# Patient Record
Sex: Male | Born: 1960 | Hispanic: No | Marital: Single | Smoking: Former smoker
Health system: Southern US, Community
[De-identification: ages and names within clinical notes are randomized; demographics above are authoritative.]

## PROBLEM LIST (undated history)

## (undated) DIAGNOSIS — R9431 Abnormal electrocardiogram [ECG] [EKG]: Secondary | ICD-10-CM

## (undated) DIAGNOSIS — E7800 Pure hypercholesterolemia, unspecified: Secondary | ICD-10-CM

## (undated) HISTORY — DX: Abnormal electrocardiogram (ECG) (EKG): R94.31

## (undated) HISTORY — DX: Pure hypercholesterolemia, unspecified: E78.00

---

## 2007-09-06 LAB — C-REACTIVE PROTEIN: CRP: 1.1 mg/dL

## 2009-12-25 ENCOUNTER — Ambulatory Visit: Payer: Self-pay | Admitting: Cardiovascular Disease

## 2009-12-25 LAB — LIPID PANEL
Cholesterol: 131 mg/dL (ref 0–200)
HDL: 50 mg/dL (ref 35–70)
LDL Cholesterol: 55 mg/dL
Triglycerides: 129 mg/dL (ref 40–160)

## 2009-12-25 LAB — BASIC METABOLIC PANEL: Glucose: 109 mg/dL

## 2010-06-03 ENCOUNTER — Encounter: Payer: Self-pay | Admitting: Cardiovascular Disease

## 2010-06-03 DIAGNOSIS — R9431 Abnormal electrocardiogram [ECG] [EKG]: Secondary | ICD-10-CM | POA: Insufficient documentation

## 2010-06-03 DIAGNOSIS — F419 Anxiety disorder, unspecified: Secondary | ICD-10-CM | POA: Insufficient documentation

## 2010-06-03 DIAGNOSIS — E785 Hyperlipidemia, unspecified: Secondary | ICD-10-CM | POA: Insufficient documentation

## 2010-06-26 ENCOUNTER — Ambulatory Visit (INDEPENDENT_AMBULATORY_CARE_PROVIDER_SITE_OTHER): Payer: BC Managed Care – PPO | Admitting: Cardiovascular Disease

## 2010-06-26 DIAGNOSIS — R9431 Abnormal electrocardiogram [ECG] [EKG]: Secondary | ICD-10-CM

## 2010-06-26 DIAGNOSIS — E7800 Pure hypercholesterolemia, unspecified: Secondary | ICD-10-CM

## 2010-08-19 ENCOUNTER — Other Ambulatory Visit: Payer: Self-pay | Admitting: Cardiovascular Disease

## 2010-08-19 DIAGNOSIS — E785 Hyperlipidemia, unspecified: Secondary | ICD-10-CM

## 2010-08-22 NOTE — Telephone Encounter (Signed)
[facility] °

## 2010-10-01 ENCOUNTER — Encounter: Payer: Self-pay | Admitting: Cardiovascular Disease

## 2010-11-16 ENCOUNTER — Other Ambulatory Visit: Payer: Self-pay | Admitting: Cardiovascular Disease

## 2010-11-18 ENCOUNTER — Other Ambulatory Visit: Payer: Self-pay | Admitting: *Deleted

## 2010-11-18 MED ORDER — NIACIN 1000 MG PO TBCR: 1.0000 | EXTENDED_RELEASE_TABLET | Freq: Every day | ORAL | Status: DC

## 2010-11-18 NOTE — Telephone Encounter (Signed)
Fax received from pharmacy. Refill completed. Jodette Angelie Kram RN  

## 2010-11-18 NOTE — Telephone Encounter (Signed)
Fax received from pharmacy. Refill completed. Jodette Addelyn Alleman RN  

## 2011-03-31 ENCOUNTER — Other Ambulatory Visit: Payer: Self-pay | Admitting: *Deleted

## 2011-03-31 MED ORDER — OMEGA-3-ACID ETHYL ESTERS 1 G PO CAPS: 1.0000 g | ORAL_CAPSULE | Freq: Three times a day (TID) | ORAL | Status: DC

## 2011-04-08 ENCOUNTER — Telehealth: Payer: Self-pay | Admitting: Cardiovascular Disease

## 2011-04-08 NOTE — Telephone Encounter (Signed)
Dr Elease Hashimoto only refills cardiac meds, msg called to pt.

## 2011-04-08 NOTE — Telephone Encounter (Signed)
Pt wants a new rx for wellbutrin. Please call to [facility] on [address]. please let him know

## 2011-05-29 ENCOUNTER — Ambulatory Visit (INDEPENDENT_AMBULATORY_CARE_PROVIDER_SITE_OTHER): Payer: BC Managed Care – PPO | Admitting: Family Medicine

## 2011-05-29 DIAGNOSIS — M79609 Pain in unspecified limb: Secondary | ICD-10-CM

## 2011-05-29 DIAGNOSIS — Z23 Encounter for immunization: Secondary | ICD-10-CM

## 2011-06-25 ENCOUNTER — Telehealth: Payer: Self-pay | Admitting: Cardiovascular Disease

## 2011-06-25 DIAGNOSIS — E785 Hyperlipidemia, unspecified: Secondary | ICD-10-CM

## 2011-06-25 NOTE — Telephone Encounter (Signed)
MSG LEFT TO COME TO APP FASTING OR BRING COPY OF LABS IF DONE ELSEWHERE,

## 2011-06-25 NOTE — Telephone Encounter (Signed)
New Msg: pt call to schedule yearly appt and wanted to know if pt needed lab work. If so please put order in.

## 2011-07-14 ENCOUNTER — Ambulatory Visit (INDEPENDENT_AMBULATORY_CARE_PROVIDER_SITE_OTHER): Payer: BC Managed Care – PPO | Admitting: Cardiovascular Disease

## 2011-07-14 ENCOUNTER — Encounter: Payer: Self-pay | Admitting: Cardiovascular Disease

## 2011-07-14 VITALS — BP 119/74 | HR 65 | Resp 12 | Ht 71.0 in | Wt 172.8 lb

## 2011-07-14 DIAGNOSIS — E785 Hyperlipidemia, unspecified: Secondary | ICD-10-CM

## 2011-07-14 LAB — BASIC METABOLIC PANEL
BUN: 14 mg/dL (ref 6–23)
CO2: 28 mEq/L (ref 19–32)
Calcium: 9.1 mg/dL (ref 8.4–10.5)
GFR: 97.64 mL/min (ref >60.00)
Glucose, Bld: 109 mg/dL — ABNORMAL HIGH (ref 70–99)

## 2011-07-14 LAB — HEPATIC FUNCTION PANEL
ALT: 24 U/L (ref 0–53)
AST: 16 U/L (ref 0–37)
Alkaline Phosphatase: 63 U/L (ref 39–117)
Bilirubin, Direct: 0 mg/dL (ref 0.0–0.3)
Total Bilirubin: 0.5 mg/dL (ref 0.3–1.2)

## 2011-07-14 LAB — LIPID PANEL
Cholesterol: 125 mg/dL (ref 0–200)
HDL: 46.1 mg/dL (ref >39.00)
LDL Cholesterol: 65 mg/dL (ref 0–99)
Triglycerides: 70 mg/dL (ref 0.0–149.0)
VLDL: 14 mg/dL (ref 0.0–40.0)

## 2011-07-14 NOTE — Assessment & Plan Note (Signed)
He seems to be doing well. We checked a fasting lipid profile as well as basic metabolic profile and hepatic profile. I'll see him in one year. He is to continue with an exercise program. He'll continue his current medications.

## 2011-07-14 NOTE — Progress Notes (Signed)
    Earney Mallet Date of Birth  [birth year] [facility]     [place] Office  [address]    [address]   [address] [place], [place]  [address]    [place], [place]  [address] [phone]  Fax  [phone]  [phone]  Fax [phone]  Problem list:  1. Hypercholesterolemia   History of Present Illness:  German is a 51 yo with hx of hyperlipidemia.  He is having some foot problems and is scheduled to see a podiatrist.  He thinks he may have some gout.  He has tried prednisone but it did not seem to help.   He has not had any cardiac problems   Current Outpatient Prescriptions on File Prior to Visit  Medication Sig Dispense Refill  . aspirin 81 MG tablet Take 81 mg by mouth daily.        Marland Kitchen buPROPion (WELLBUTRIN XL) 150 MG 24 hr tablet Take 150 mg by mouth daily.        Marland Kitchen LIPITOR 40 MG tablet TAKE ONE (1) TABLET(S) ONCE DAILY  30 tablet  11  . niacin (NIASPAN) 1000 MG CR tablet Take 1 tablet (1,000 mg total) by mouth at bedtime.  90 tablet  1  . omega-3 acid ethyl esters (LOVAZA) 1 G capsule Take 1 capsule (1 g total) by mouth 3 (three) times daily.  90 capsule  3  . Multiple Vitamin (MULTIVITAMIN) tablet Take 1 tablet by mouth daily.        . Niacin 1000 MG TBCR Take 1 tablet (1,000 mg total) by mouth daily.  90 each  1    No Known Allergies  Past Medical History  Diagnosis Date  . High cholesterol   . Abnormal EKG     No past surgical history on file.  History  Smoking status  . Former Smoker  . Quit date: 03/06/2006  Smokeless tobacco  . Not on file    History  Alcohol Use  . Yes    Family History  Problem Relation Age of Onset  . Leukemia Father     deceased age 34  . Coronary artery disease Father     CABG/AAA/Stents    Reviw of Systems:  Reviewed in the HPI.  All other systems are negative.  Physical Exam: Blood pressure 119/74, pulse 65, resp. rate 12, weight 172 lb 12.8 oz (78.382 kg). General: Well developed, well  nourished, in no acute distress.  Head: Normocephalic, atraumatic, sclera non-icteric, mucus membranes are moist,   Neck: Supple. Negative for carotid bruits. JVD not elevated.  Lungs: Clear bilaterally to auscultation without wheezes, rales, or rhonchi. Breathing is unlabored.  Heart: RRR with S1 S2. No murmurs, rubs, or gallops appreciated.  Abdomen: Soft, non-tender, non-distended with normoactive bowel sounds. No hepatomegaly. No rebound/guarding. No obvious abdominal masses.  Msk:  Strength and tone appear normal for age.  Extremities: No clubbing or cyanosis. No edema.  Distal pedal pulses are 2+ and equal bilaterally.  Neuro: Alert and oriented X 3. Moves all extremities spontaneously.  Psych:  Responds to questions appropriately with a normal affect.  ECG: NSR. Normal ecg   Assessment / Plan:

## 2011-07-14 NOTE — Patient Instructions (Signed)
Your physician wants you to follow-up in: 1 year.   You will receive a reminder letter in the mail two months in advance. If you don't receive a letter, please call our office to schedule the follow-up appointment.  Your physician recommends that you return for fasting lab work in: 1 year (day of appt)

## 2011-08-11 ENCOUNTER — Other Ambulatory Visit: Payer: Self-pay | Admitting: *Deleted

## 2011-08-11 MED ORDER — NIACIN ER (ANTIHYPERLIPIDEMIC) 1000 MG PO TBCR: 1000.0000 mg | EXTENDED_RELEASE_TABLET | Freq: Every day | ORAL | Status: DC

## 2011-08-11 NOTE — Telephone Encounter (Signed)
Fax Received. Refill Completed. Jeremy Blair (R.M.A)   

## 2011-09-23 ENCOUNTER — Other Ambulatory Visit: Payer: Self-pay | Admitting: *Deleted

## 2011-09-23 DIAGNOSIS — E785 Hyperlipidemia, unspecified: Secondary | ICD-10-CM

## 2011-09-23 MED ORDER — ATORVASTATIN CALCIUM 40 MG PO TABS: 40.0000 mg | ORAL_TABLET | Freq: Every day | ORAL | Status: DC

## 2012-06-03 ENCOUNTER — Other Ambulatory Visit: Payer: Self-pay | Admitting: *Deleted

## 2012-06-03 MED ORDER — OMEGA-3-ACID ETHYL ESTERS 1 G PO CAPS: 1.0000 g | ORAL_CAPSULE | Freq: Three times a day (TID) | ORAL | Status: DC

## 2012-06-03 NOTE — Telephone Encounter (Signed)
Left message for patient to call office to make an appointment for more refill to be dispensed. number provided. Fax Received. Refill Completed. Jeremy Blair (R.M.A)

## 2012-06-30 ENCOUNTER — Other Ambulatory Visit (INDEPENDENT_AMBULATORY_CARE_PROVIDER_SITE_OTHER): Payer: BC Managed Care – PPO

## 2012-06-30 ENCOUNTER — Ambulatory Visit (INDEPENDENT_AMBULATORY_CARE_PROVIDER_SITE_OTHER): Payer: BC Managed Care – PPO | Admitting: Cardiovascular Disease

## 2012-06-30 ENCOUNTER — Encounter: Payer: Self-pay | Admitting: Cardiovascular Disease

## 2012-06-30 VITALS — BP 116/78 | HR 71 | Ht 71.0 in | Wt 173.8 lb

## 2012-06-30 DIAGNOSIS — E785 Hyperlipidemia, unspecified: Secondary | ICD-10-CM

## 2012-06-30 LAB — BASIC METABOLIC PANEL
BUN: 11 mg/dL (ref 6–23)
CO2: 31 mEq/L (ref 19–32)
Chloride: 102 mEq/L (ref 96–112)
Creatinine, Ser: 1 mg/dL (ref 0.4–1.5)
Glucose, Bld: 107 mg/dL — ABNORMAL HIGH (ref 70–99)

## 2012-06-30 LAB — LIPID PANEL
Cholesterol: 144 mg/dL (ref 0–200)
HDL: 40.5 mg/dL (ref >39.00)
VLDL: 35.8 mg/dL (ref 0.0–40.0)

## 2012-06-30 LAB — HEPATIC FUNCTION PANEL
Albumin: 3.9 g/dL (ref 3.5–5.2)
Total Protein: 7.1 g/dL (ref 6.0–8.3)

## 2012-06-30 NOTE — Progress Notes (Signed)
     Earney Mallet Date of Birth  May 18, 1961 [facility]     [place] Office  [address]    [address]   [address] [place], [place]  [address]    [place], [place]  [address] [phone]  Fax  [phone]  [phone]  Fax [phone]  Problem list:  1. Hypercholesterolemia   History of Present Illness:  Loman is a 52 yo with hx of hyperlipidemia.  He is having some foot problems and is scheduled to see a podiatrist.  He thinks he may have some gout.  He has tried prednisone but it did not seem to help.   He has not had any cardiac problems  Feb. 5, 2014:    Current Outpatient Prescriptions on File Prior to Visit  Medication Sig Dispense Refill  . aspirin 81 MG tablet Take 81 mg by mouth daily.        Marland Kitchen atorvastatin (LIPITOR) 40 MG tablet Take 1 tablet (40 mg total) by mouth daily.  30 tablet  11  . buPROPion (WELLBUTRIN XL) 150 MG 24 hr tablet Take 150 mg by mouth daily.        . Multiple Vitamin (MULTIVITAMIN) tablet Take 1 tablet by mouth daily.        . niacin (NIASPAN) 1000 MG CR tablet Take 1 tablet (1,000 mg total) by mouth at bedtime.  90 tablet  5  . omega-3 acid ethyl esters (LOVAZA) 1 G capsule Take 1 capsule (1 g total) by mouth 3 (three) times daily.  90 capsule  1    No Known Allergies  Past Medical History  Diagnosis Date  . High cholesterol   . Abnormal EKG     No past surgical history on file.  History  Smoking status  . Former Smoker  . Quit date: 03/06/2006  Smokeless tobacco  . Not on file    History  Alcohol Use  . Yes    Family History  Problem Relation Age of Onset  . Leukemia Father     deceased age 89  . Coronary artery disease Father     CABG/AAA/Stents    Reviw of Systems:  Reviewed in the HPI.  All other systems are negative.  Physical Exam: Blood pressure 116/78, pulse 71, height 5\' 11"  (1.803 m), weight 173 lb 12.8 oz (78.835 kg). General: Well developed, well nourished, in no acute  distress.  Head: Normocephalic, atraumatic, sclera non-icteric, mucus membranes are moist,   Neck: Supple. Negative for carotid bruits. JVD not elevated.  Lungs: Clear bilaterally to auscultation without wheezes, rales, or rhonchi. Breathing is unlabored.  Heart: RRR with S1 S2. No murmurs, rubs, or gallops appreciated.  Abdomen: Soft, non-tender, non-distended with normoactive bowel sounds. No hepatomegaly. No rebound/guarding. No obvious abdominal masses.  Msk:  Strength and tone appear normal for age.  Extremities: No clubbing or cyanosis. No edema.  Distal pedal pulses are 2+ and equal bilaterally.  Neuro: Alert and oriented X 3. Moves all extremities spontaneously.  Psych:  Responds to questions appropriately with a normal affect.  ECG: 06/30/2012: Normal sinus rhythm at 71 beats a minute. He has no ST or T wave changes.   Assessment / Plan:

## 2012-06-30 NOTE — Patient Instructions (Addendum)
Your physician wants you to follow-up in: 1 year  You will receive a reminder letter in the mail two months in advance. If you don't receive a letter, please call our office to schedule the follow-up appointment.  Your physician recommends that you continue on your current medications as directed. Please refer to the Current Medication list given to you today.   Your physician recommends that you return for a FASTING lipid profile: 1 year

## 2012-06-30 NOTE — Assessment & Plan Note (Signed)
His lipids has been stable.  Lipids are pending for today.  We discussed some of the recent medical studies concerning fenofibrate and Niaspan and their limited benefit in the studies.  I'll see him again in one year for followup office visit and fasting labs.

## 2012-07-10 ENCOUNTER — Other Ambulatory Visit: Payer: Self-pay

## 2012-08-05 ENCOUNTER — Other Ambulatory Visit: Payer: Self-pay | Admitting: *Deleted

## 2012-08-05 MED ORDER — NIACIN ER (ANTIHYPERLIPIDEMIC) 1000 MG PO TBCR: 1000.0000 mg | EXTENDED_RELEASE_TABLET | Freq: Every day | ORAL | Status: DC

## 2012-08-05 NOTE — Telephone Encounter (Signed)
Fax Received. Refill Completed. Octavia Chowoe (R.M.A)   

## 2012-08-12 ENCOUNTER — Telehealth: Payer: Self-pay | Admitting: *Deleted

## 2012-08-12 NOTE — Telephone Encounter (Signed)
Pt informed I will call in to pharmacy that generic is acceptable, pharm called.

## 2012-08-12 NOTE — Telephone Encounter (Signed)
New Problem:    Patient called in returning your call to say that he would be ok switching to generic.  Patient did not recollect any issues with the generic.  Please call back.

## 2012-08-12 NOTE — Telephone Encounter (Signed)
Asked him to call back to see if its ok to switch his niacin to generic, no notes stating he is intolerant of generic formulation.

## 2012-10-25 ENCOUNTER — Other Ambulatory Visit: Payer: Self-pay | Admitting: *Deleted

## 2012-10-25 DIAGNOSIS — E785 Hyperlipidemia, unspecified: Secondary | ICD-10-CM

## 2012-10-25 MED ORDER — ATORVASTATIN CALCIUM 40 MG PO TABS: 40.0000 mg | ORAL_TABLET | Freq: Every day | ORAL | Status: DC

## 2012-10-25 NOTE — Telephone Encounter (Signed)
Fax Received. Refill Completed. Barb Shear Chowoe (R.M.A)   

## 2013-03-31 ENCOUNTER — Other Ambulatory Visit: Payer: Self-pay

## 2013-05-13 ENCOUNTER — Other Ambulatory Visit: Payer: Self-pay | Admitting: Cardiovascular Disease

## 2013-06-12 ENCOUNTER — Other Ambulatory Visit: Payer: Self-pay | Admitting: Cardiovascular Disease

## 2013-06-30 ENCOUNTER — Encounter: Payer: Self-pay | Admitting: Cardiovascular Disease

## 2013-07-09 ENCOUNTER — Other Ambulatory Visit: Payer: Self-pay | Admitting: Cardiovascular Disease

## 2013-09-12 ENCOUNTER — Other Ambulatory Visit: Payer: BC Managed Care – PPO

## 2013-09-15 ENCOUNTER — Ambulatory Visit: Payer: BC Managed Care – PPO | Admitting: Cardiovascular Disease

## 2013-10-03 ENCOUNTER — Other Ambulatory Visit: Payer: Self-pay | Admitting: Cardiovascular Disease

## 2013-10-10 ENCOUNTER — Ambulatory Visit (INDEPENDENT_AMBULATORY_CARE_PROVIDER_SITE_OTHER): Payer: BC Managed Care – PPO | Admitting: *Deleted

## 2013-10-10 DIAGNOSIS — E785 Hyperlipidemia, unspecified: Secondary | ICD-10-CM

## 2013-10-10 LAB — HEPATIC FUNCTION PANEL
ALBUMIN: 3.8 g/dL (ref 3.5–5.2)
ALK PHOS: 56 U/L (ref 39–117)
ALT: 23 U/L (ref 0–53)
AST: 14 U/L (ref 0–37)
BILIRUBIN DIRECT: 0 mg/dL (ref 0.0–0.3)
BILIRUBIN TOTAL: 0.7 mg/dL (ref 0.2–1.2)
Total Protein: 7 g/dL (ref 6.0–8.3)

## 2013-10-10 LAB — BASIC METABOLIC PANEL
BUN: 18 mg/dL (ref 6–23)
CHLORIDE: 103 meq/L (ref 96–112)
CO2: 26 meq/L (ref 19–32)
CREATININE: 1 mg/dL (ref 0.4–1.5)
Calcium: 9.1 mg/dL (ref 8.4–10.5)
GFR: 83.41 mL/min (ref >60.00)
GLUCOSE: 99 mg/dL (ref 70–99)
Potassium: 3.8 mEq/L (ref 3.5–5.1)
Sodium: 137 mEq/L (ref 135–145)

## 2013-10-10 LAB — LIPID PANEL
CHOL/HDL RATIO: 3
Cholesterol: 141 mg/dL (ref 0–200)
HDL: 40.7 mg/dL (ref >39.00)
LDL CALC: 77 mg/dL (ref 0–99)
Triglycerides: 117 mg/dL (ref 0.0–149.0)
VLDL: 23.4 mg/dL (ref 0.0–40.0)

## 2013-10-12 ENCOUNTER — Ambulatory Visit (INDEPENDENT_AMBULATORY_CARE_PROVIDER_SITE_OTHER): Payer: BC Managed Care – PPO | Admitting: Cardiovascular Disease

## 2013-10-12 ENCOUNTER — Encounter: Payer: Self-pay | Admitting: Cardiovascular Disease

## 2013-10-12 VITALS — BP 110/78 | HR 81 | Ht 71.0 in | Wt 169.0 lb

## 2013-10-12 DIAGNOSIS — E785 Hyperlipidemia, unspecified: Secondary | ICD-10-CM

## 2013-10-12 NOTE — Patient Instructions (Signed)
Your physician recommends that you continue on your current medications as directed. Please refer to the Current Medication list given to you today.  Your physician recommends that you return for lab work in: 1 year a week or a few days before your office visit with Dr. Elease HashimotoNahser.  You will need to fast for this appointment - nothing but water after midnight  Your physician wants you to follow-up in: 1 year with Dr. Elease HashimotoNahser. You will receive a reminder letter in the mail two months in advance. If you don't receive a letter, please call our office to schedule the follow-up appointment.

## 2013-10-12 NOTE — Progress Notes (Signed)
     Jeremy Blair Date of Birth  11/03/1960 [facility]     [place] Office  [address]    [address]   [address] [place], [place]  [address]    [place], [place]  [id number] [phone]  Fax  [phone]  [phone]  Fax [phone]  Problem list:  1. Hypercholesterolemia   History of Present Illness:  Jeremy Blair is a 53 yo with hx of hyperlipidemia.  He is having some foot problems and is scheduled to see a podiatrist.  He thinks he may have some gout.  He has tried prednisone but it did not seem to help.   He has not had any cardiac problems  Feb. 5, 2014:  Oct 12, 2013: Jeremy Blair is doing ok.   He stopped his Niacin several months ago due to itching ( different from the flushing)   Labs from 2 days ago look good.   Current Outpatient Prescriptions on File Prior to Visit  Medication Sig Dispense Refill  . aspirin 81 MG tablet Take 81 mg by mouth daily.        Marland Kitchen. atorvastatin (LIPITOR) 40 MG tablet TAKE 1 TABLET BY MOUTH EVERY DAY. PATIENT NEEDS TO MAKE AN APPT  30 tablet  0  . buPROPion (WELLBUTRIN XL) 150 MG 24 hr tablet Take 150 mg by mouth daily.        . Multiple Vitamin (MULTIVITAMIN) tablet Take 1 tablet by mouth daily.        Marland Kitchen. omega-3 acid ethyl esters (LOVAZA) 1 G capsule TAKE ONE CAPSULE BY MOUTH THREE TIMES DAILY  90 capsule  3   No current facility-administered medications on file prior to visit.    No Known Allergies  Past Medical History  Diagnosis Date  . High cholesterol   . Abnormal EKG     No past surgical history on file.  History  Smoking status  . Former Smoker  . Quit date: 03/06/2006  Smokeless tobacco  . Not on file    History  Alcohol Use  . Yes    Family History  Problem Relation Age of Onset  . Leukemia Father     deceased age 55  . Coronary artery disease Father     CABG/AAA/Stents    Reviw of Systems:  Reviewed in the HPI.  All other systems are negative.  Physical Exam: Blood pressure 110/78, pulse  81, height 5\' 11"  (1.803 m), weight 169 lb (76.658 kg). General: Well developed, well nourished, in no acute distress.  Head: Normocephalic, atraumatic, sclera non-icteric, mucus membranes are moist,   Neck: Supple. Negative for carotid bruits. JVD not elevated.  Lungs: Clear bilaterally to auscultation without wheezes, rales, or rhonchi. Breathing is unlabored.  Heart: RRR with S1 S2. No murmurs, rubs, or gallops appreciated.  Abdomen: Soft, non-tender, non-distended with normoactive bowel sounds. No hepatomegaly. No rebound/guarding. No obvious abdominal masses.  Msk:  Strength and tone appear normal for age.  Extremities: No clubbing or cyanosis. No edema.  Distal pedal pulses are 2+ and equal bilaterally.  Neuro: Alert and oriented X 3. Moves all extremities spontaneously.  Psych:  Responds to questions appropriately with a normal affect.  ECG: Oct 12, 2013: NSR at 81, normal ECG.      Assessment / Plan:

## 2013-10-12 NOTE — Assessment & Plan Note (Signed)
His lipid levels have been well-controlled. He continues on atorvastatin. He stopped the niacin because of some itching. At this point I do not think it we restart the niacin. We will continue on the atorvastatin.  I'll see him again in one year for fasting labs and office visit.

## 2013-11-07 ENCOUNTER — Other Ambulatory Visit: Payer: Self-pay | Admitting: Cardiovascular Disease

## 2013-12-02 ENCOUNTER — Other Ambulatory Visit: Payer: Self-pay | Admitting: Cardiovascular Disease

## 2013-12-15 ENCOUNTER — Other Ambulatory Visit: Payer: Self-pay | Admitting: Cardiovascular Disease

## 2014-01-02 ENCOUNTER — Other Ambulatory Visit: Payer: Self-pay | Admitting: Cardiovascular Disease

## 2014-08-04 ENCOUNTER — Other Ambulatory Visit: Payer: Self-pay | Admitting: Cardiovascular Disease

## 2014-12-16 ENCOUNTER — Other Ambulatory Visit: Payer: Self-pay | Admitting: Cardiovascular Disease

## 2014-12-20 ENCOUNTER — Telehealth: Payer: Self-pay | Admitting: Nurse Practitioner

## 2014-12-20 NOTE — Telephone Encounter (Signed)
Left message for patient that it has been greater than 1 year since we have seen him and to please call and schedule an appointment

## 2014-12-27 ENCOUNTER — Other Ambulatory Visit: Payer: Self-pay | Admitting: Nurse Practitioner

## 2014-12-27 DIAGNOSIS — E785 Hyperlipidemia, unspecified: Secondary | ICD-10-CM

## 2015-01-17 ENCOUNTER — Other Ambulatory Visit: Payer: Self-pay | Admitting: Cardiovascular Disease

## 2015-01-21 ENCOUNTER — Other Ambulatory Visit: Payer: Self-pay | Admitting: Cardiovascular Disease

## 2015-01-30 ENCOUNTER — Other Ambulatory Visit (INDEPENDENT_AMBULATORY_CARE_PROVIDER_SITE_OTHER): Payer: BLUE CROSS/BLUE SHIELD | Admitting: *Deleted

## 2015-01-30 DIAGNOSIS — E785 Hyperlipidemia, unspecified: Secondary | ICD-10-CM

## 2015-01-30 LAB — LIPID PANEL
Cholesterol: 123 mg/dL (ref 0–200)
HDL: 40.8 mg/dL (ref >39.00)
LDL Cholesterol: 56 mg/dL (ref 0–99)
NonHDL: 82.16
TRIGLYCERIDES: 133 mg/dL (ref 0.0–149.0)
Total CHOL/HDL Ratio: 3
VLDL: 26.6 mg/dL (ref 0.0–40.0)

## 2015-01-30 LAB — HEPATIC FUNCTION PANEL
ALT: 21 U/L (ref 0–53)
AST: 18 U/L (ref 0–37)
Albumin: 3.9 g/dL (ref 3.5–5.2)
Alkaline Phosphatase: 84 U/L (ref 39–117)
BILIRUBIN DIRECT: 0 mg/dL (ref 0.0–0.3)
Total Bilirubin: 0.3 mg/dL (ref 0.2–1.2)
Total Protein: 7.1 g/dL (ref 6.0–8.3)

## 2015-01-30 LAB — BASIC METABOLIC PANEL
BUN: 12 mg/dL (ref 6–23)
CHLORIDE: 102 meq/L (ref 96–112)
CO2: 29 mEq/L (ref 19–32)
Calcium: 9.2 mg/dL (ref 8.4–10.5)
Creatinine, Ser: 0.97 mg/dL (ref 0.40–1.50)
GFR: 84.99 mL/min (ref >60.00)
GLUCOSE: 97 mg/dL (ref 70–99)
Potassium: 4 mEq/L (ref 3.5–5.1)
Sodium: 139 mEq/L (ref 135–145)

## 2015-02-01 ENCOUNTER — Encounter: Payer: Self-pay | Admitting: Cardiovascular Disease

## 2015-02-01 ENCOUNTER — Ambulatory Visit (INDEPENDENT_AMBULATORY_CARE_PROVIDER_SITE_OTHER): Payer: BLUE CROSS/BLUE SHIELD | Admitting: Cardiovascular Disease

## 2015-02-01 VITALS — BP 130/84 | HR 68 | Ht 69.0 in | Wt 174.5 lb

## 2015-02-01 DIAGNOSIS — E785 Hyperlipidemia, unspecified: Secondary | ICD-10-CM

## 2015-02-01 MED ORDER — OMEGA-3-ACID ETHYL ESTERS 1 G PO CAPS: 1.0000 | ORAL_CAPSULE | Freq: Three times a day (TID) | ORAL | Status: DC

## 2015-02-01 MED ORDER — ATORVASTATIN CALCIUM 40 MG PO TABS: 40.0000 mg | ORAL_TABLET | Freq: Every day | ORAL | Status: DC

## 2015-02-01 NOTE — Patient Instructions (Signed)
Medication Instructions:  Your physician recommends that you continue on your current medications as directed. Please refer to the Current Medication list given to you today.   Labwork: Your physician recommends that you return for lab work in: 1 year on the day of or a few days before your office visit with Dr. Nahser.  You will need to FAST for this appointment - nothing to eat or drink after midnight the night before except water.    Testing/Procedures: None Ordered   Follow-Up: Your physician wants you to follow-up in: 1 year with Dr. Nahser.  You will receive a reminder letter in the mail two months in advance. If you don't receive a letter, please call our office to schedule the follow-up appointment.    

## 2015-02-01 NOTE — Progress Notes (Signed)
Jeremy Blair Date of Birth  28-Jul-1960 [facility]     [place] Office  [address]    [address]   [address] [place], [place]  [address]    [place], [place]  [address] [phone]  Fax  [phone]  [phone]  Fax [phone]  Problem list:  1. Hypercholesterolemia   History of Present Illness:  Jeremy Blair is a 54 yo with hx of hyperlipidemia.  He is having some foot problems and is scheduled to see a podiatrist.  He thinks he may have some gout.  He has tried prednisone but it did not seem to help.   He has not had any cardiac problems  Feb. 5, 2014:  Oct 12, 2013: Jeremy Blair is doing ok.   He stopped his Niacin several months ago due to itching ( different from the flushing)   Labs from 2 days ago look good.   Sept. 8, 2016:  Doing well.  Not exercising much .  Lipids recently look great .   Current Outpatient Prescriptions on File Prior to Visit  Medication Sig Dispense Refill  . aspirin 81 MG tablet Take 81 mg by mouth daily.      Marland Kitchen buPROPion (WELLBUTRIN XL) 150 MG 24 hr tablet Take 150 mg by mouth daily.      . Multiple Vitamin (MULTIVITAMIN) tablet Take 1 tablet by mouth daily.       No current facility-administered medications on file prior to visit.    No Known Allergies  Past Medical History  Diagnosis Date  . High cholesterol   . Abnormal EKG     No past surgical history on file.  History  Smoking status  . Former Smoker  . Quit date: 03/06/2006  Smokeless tobacco  . Not on file    History  Alcohol Use  . Yes    Family History  Problem Relation Age of Onset  . Leukemia Father     deceased age 57  . Coronary artery disease Father     CABG/AAA/Stents    Reviw of Systems:  Reviewed in the HPI.  All other systems are negative.  Physical Exam: Blood pressure 130/84, pulse 68, height  (1.753 m), weight 79.153 kg (174 lb 8 oz). General: Well developed, well nourished, in no acute distress.  Head:  Normocephalic, atraumatic, sclera non-icteric, mucus membranes are moist,   Neck: Supple. Negative for carotid bruits. JVD not elevated.  Lungs: Clear bilaterally to auscultation without wheezes, rales, or rhonchi. Breathing is unlabored.  Heart: RRR with S1 S2. No murmurs, rubs, or gallops appreciated.  Abdomen: Soft, non-tender, non-distended with normoactive bowel sounds. No hepatomegaly. No rebound/guarding. No obvious abdominal masses.  Msk:  Strength and tone appear normal for age.  Extremities: No clubbing or cyanosis. No edema.  Distal pedal pulses are 2+ and equal bilaterally.  Neuro: Alert and oriented X 3. Moves all extremities spontaneously.  Psych:  Responds to questions appropriately with a normal affect.  ECG: Sept. 8, 2016:   NSR at 68.  Normal ecg    Assessment / Plan:   1. Hypercholesterolemia-  Labs look great  Cont Atorvastatin  Will see him in 1 year with fasting labs.    Makaylia Hewett, Deloris Ping, MD  02/01/2015 4:00 PM    [facility] [address] [place],  [address] [place], [place]  [address] Pager [phone] Phone: [phone]; Fax: [phone]   [facility] Office  [address]  [place], [place]  [address] [phone]   Fax [phone]

## 2015-02-12 ENCOUNTER — Other Ambulatory Visit: Payer: Self-pay

## 2015-02-12 ENCOUNTER — Other Ambulatory Visit: Payer: Self-pay | Admitting: Cardiovascular Disease

## 2015-02-12 MED ORDER — OMEGA-3-ACID ETHYL ESTERS 1 G PO CAPS: 1.0000 | ORAL_CAPSULE | Freq: Three times a day (TID) | ORAL | Status: DC

## 2015-02-12 MED ORDER — ATORVASTATIN CALCIUM 40 MG PO TABS: 40.0000 mg | ORAL_TABLET | Freq: Every day | ORAL | Status: DC

## 2015-02-12 NOTE — Telephone Encounter (Signed)
Jeremy Mixer, MD at 02/01/2015 3:59 PM  1. Hypercholesterolemia-  Labs look great  Cont Atorvastatin  Will see him in 1 year with fasting labs.  omega-3 acid ethyl esters (LOVAZA) 1 G capsule Reorder   Patient called in stating his refills were sent to [facility] and should have been sent to [facility].  atorvastatin (LIPITOR) 40 MG tablet 90 tablet 3 02/01/2015     Take 1 tablet (40 mg total) by mouth daily at 6 PM. - Oral   omega-3 acid ethyl esters (LOVAZA) 1 G capsule (Discontinued) 90 capsule 11 02/01/2015 02/12/2015     Take 1 capsule (1 g total) by mouth 3 (three) times daily. - Oral    Reason for Discontinue: Reorder

## 2015-02-16 ENCOUNTER — Other Ambulatory Visit: Payer: Self-pay | Admitting: Cardiovascular Disease

## 2015-02-20 ENCOUNTER — Other Ambulatory Visit: Payer: Self-pay | Admitting: Cardiovascular Disease

## 2016-03-01 ENCOUNTER — Other Ambulatory Visit: Payer: Self-pay | Admitting: Cardiovascular Disease

## 2016-03-03 ENCOUNTER — Other Ambulatory Visit: Payer: Self-pay | Admitting: Cardiovascular Disease

## 2016-04-01 ENCOUNTER — Other Ambulatory Visit: Payer: Self-pay | Admitting: Cardiovascular Disease

## 2016-04-07 ENCOUNTER — Telehealth: Payer: Self-pay | Admitting: Cardiovascular Disease

## 2016-04-07 DIAGNOSIS — E785 Hyperlipidemia, unspecified: Secondary | ICD-10-CM

## 2016-04-07 NOTE — Telephone Encounter (Signed)
CMET and Lipid Panel entered.  Needs lab appt.  LMTCB

## 2016-04-07 NOTE — Telephone Encounter (Signed)
Mr. Jeremy Blair is calling because he is wanting to have lab work done before he sees Dr. Elease HashimotoNahser on 05/02/16 at 9:30am . Thanks

## 2016-04-08 NOTE — Telephone Encounter (Signed)
Lab appt made for 04/25/16.

## 2016-04-25 ENCOUNTER — Other Ambulatory Visit: Payer: BLUE CROSS/BLUE SHIELD | Admitting: *Deleted

## 2016-04-25 DIAGNOSIS — E785 Hyperlipidemia, unspecified: Secondary | ICD-10-CM

## 2016-04-25 LAB — LIPID PANEL
Cholesterol: 156 mg/dL (ref <200)
HDL: 40 mg/dL — ABNORMAL LOW (ref >40)
LDL Cholesterol: 88 mg/dL (ref <100)
TRIGLYCERIDES: 141 mg/dL (ref <150)
Total CHOL/HDL Ratio: 3.9 Ratio (ref <5.0)
VLDL: 28 mg/dL (ref <30)

## 2016-04-25 LAB — COMPREHENSIVE METABOLIC PANEL
ALT: 21 U/L (ref 9–46)
AST: 15 U/L (ref 10–35)
Albumin: 3.9 g/dL (ref 3.6–5.1)
Alkaline Phosphatase: 77 U/L (ref 40–115)
BUN: 13 mg/dL (ref 7–25)
CHLORIDE: 103 mmol/L (ref 98–110)
CO2: 27 mmol/L (ref 20–31)
CREATININE: 0.79 mg/dL (ref 0.70–1.33)
Calcium: 9.1 mg/dL (ref 8.6–10.3)
GLUCOSE: 109 mg/dL — AB (ref 65–99)
Potassium: 4.2 mmol/L (ref 3.5–5.3)
SODIUM: 139 mmol/L (ref 135–146)
Total Bilirubin: 0.4 mg/dL (ref 0.2–1.2)
Total Protein: 6.4 g/dL (ref 6.1–8.1)

## 2016-05-02 ENCOUNTER — Ambulatory Visit (INDEPENDENT_AMBULATORY_CARE_PROVIDER_SITE_OTHER): Payer: BLUE CROSS/BLUE SHIELD | Admitting: Cardiovascular Disease

## 2016-05-02 ENCOUNTER — Encounter: Payer: Self-pay | Admitting: Cardiovascular Disease

## 2016-05-02 VITALS — BP 124/80 | HR 60 | Ht 69.0 in | Wt 179.0 lb

## 2016-05-02 DIAGNOSIS — E782 Mixed hyperlipidemia: Secondary | ICD-10-CM | POA: Diagnosis not present

## 2016-05-02 DIAGNOSIS — E785 Hyperlipidemia, unspecified: Secondary | ICD-10-CM | POA: Diagnosis not present

## 2016-05-02 NOTE — Progress Notes (Signed)
Jeremy Blair Date of Birth  09/28/1960 [facility]     [place] Office  [address]    [address]   [address] [place], [place]  [address]    [place], [place]  [address] [phone]  Fax  [phone]  [phone]  Fax [phone]  Problem list:  1. Hypercholesterolemia   History of Present Illness:  Jeremy Blair is a 55 yo with hx of hyperlipidemia.  He is having some foot problems and is scheduled to see a podiatrist.  He thinks he may have some gout.  He has tried prednisone but it did not seem to help.   He has not had any cardiac problems  Feb. 5, 2014:  Oct 12, 2013: Jeremy Blair is doing ok.   He stopped his Niacin several months ago due to itching ( different from the flushing)   Labs from 2 days ago look good.   Sept. 8, 2016:  Doing well.  Not exercising much .  Lipids recently look great .   Dec. 8, 2017:  No CP or dyspnea.  Not exercising as much.   Not enough time .  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  Has gained some weight.   Current Outpatient Prescriptions on File Prior to Visit  Medication Sig Dispense Refill  . aspirin 81 MG tablet Take 81 mg by mouth daily.      Marland Kitchen. atorvastatin (LIPITOR) 40 MG tablet Take 1 tablet (40 mg total) by mouth daily at 6 PM. Please call and schedule an appointment for further refills 15 tablet 0  . buPROPion (WELLBUTRIN XL) 150 MG 24 hr tablet Take 300 mg by mouth daily.     . Multiple Vitamin (MULTIVITAMIN) tablet Take 1 tablet by mouth daily.      Marland Kitchen. omega-3 acid ethyl esters (LOVAZA) 1 G capsule Take 1 capsule (1 g total) by mouth 3 (three) times daily. 270 capsule 3   No current facility-administered medications on file prior to visit.     No Known Allergies  Past Medical History:  Diagnosis Date  . Abnormal EKG   . High cholesterol     No past surgical history on file.  History  Smoking Status  . Former Smoker  . Quit date: 03/06/2006  Smokeless Tobacco  . Not on file    History   Alcohol Use  . Yes    Family History  Problem Relation Age of Onset  . Leukemia Father     deceased age 57  . Coronary artery disease Father     CABG/AAA/Stents    Reviw of Systems:  Reviewed in the HPI.  All other systems are negative.  Physical Exam: Blood pressure 124/80, pulse 60, height 5\' 9"  (1.753 m), weight 179 lb (81.2 kg). General: Well developed, well nourished, in no acute distress. Head: Normocephalic, atraumatic, sclera non-icteric, mucus membranes are moist,  Neck: Supple. Negative for carotid bruits. JVD not elevated. Lungs: Clear bilaterally to auscultation without wheezes, rales, or rhonchi. Breathing is unlabored. Heart: RRR with S1 S2. No murmurs, rubs, or gallops appreciated. Abdomen: Soft, non-tender, non-distended with normoactive bowel sounds. No hepatomegaly. No rebound/guarding. No obvious abdominal masses. Msk:  Strength and tone appear normal for age. Extremities: No clubbing or cyanosis. No edema.  Distal pedal pulses are 2+ and equal bilaterally. Neuro: Alert and oriented X 3. Moves all extremities spontaneously.  Psych:  Responds to questions appropriately with a normal affect.  ECG: Dec. 8, 2017:  NSR at 60.  Normal ECG   Assessment / Plan:   1. Hypercholesterolemia-  Labs look great  Cont Atorvastatin  He has stopped the niaspan.  Will see him in 1 year with fasting labs.    Kristeen MissPhilip Nahser, MD  05/02/2016 9:54 AM    [facility] [address],  [address] [place], [place]  [address] Pager [phone] Phone: [phone]; Fax: [phone]) [phone]   [facility]  [address] [place], [place]  [address] [phone]   Fax [phone]

## 2016-05-02 NOTE — Patient Instructions (Signed)
Medication Instructions:  Your physician recommends that you continue on your current medications as directed. Please refer to the Current Medication list given to you today.   Labwork: Your physician recommends that you return for lab work in: ONE YEAR (04/2017)   Testing/Procedures: none  Follow-Up: Your physician wants you to follow-up in: 12 MONTHS WITH DR. Elease HashimotoNAHSER. You will receive a reminder letter in the mail two months in advance. If you don't receive a letter, please call our office to schedule the follow-up appointment.   Any Other Special Instructions Will Be Listed Below (If Applicable).     If you need a refill on your cardiac medications before your next appointment, please call your pharmacy.

## 2016-05-03 ENCOUNTER — Other Ambulatory Visit: Payer: Self-pay | Admitting: Cardiovascular Disease

## 2016-05-07 ENCOUNTER — Ambulatory Visit (INDEPENDENT_AMBULATORY_CARE_PROVIDER_SITE_OTHER): Payer: BLUE CROSS/BLUE SHIELD | Admitting: Family Medicine

## 2016-05-07 ENCOUNTER — Encounter: Payer: Self-pay | Admitting: Family Medicine

## 2016-05-07 ENCOUNTER — Ambulatory Visit: Payer: BLUE CROSS/BLUE SHIELD | Admitting: Family Medicine

## 2016-05-07 VITALS — BP 132/74 | HR 90 | Temp 97.7°F | Resp 16 | Ht 69.0 in | Wt 177.6 lb

## 2016-05-07 DIAGNOSIS — Z1159 Encounter for screening for other viral diseases: Secondary | ICD-10-CM | POA: Diagnosis not present

## 2016-05-07 DIAGNOSIS — Z23 Encounter for immunization: Secondary | ICD-10-CM

## 2016-05-07 DIAGNOSIS — R7301 Impaired fasting glucose: Secondary | ICD-10-CM | POA: Diagnosis not present

## 2016-05-07 DIAGNOSIS — Z0000 Encounter for general adult medical examination without abnormal findings: Secondary | ICD-10-CM | POA: Diagnosis not present

## 2016-05-07 NOTE — Patient Instructions (Addendum)
   IF you received an x-ray today, you will receive an invoice from [facility]. Please contact [facility] at [phone] with questions or concerns regarding your invoice.   IF you received labwork today, you will receive an invoice from [facility]. Please contact [facility] at [phone] with questions or concerns regarding your invoice.   Our billing staff will not be able to assist you with questions regarding bills from these companies.  You will be contacted with the lab results as soon as they are available. The fastest way to get your results is to activate your My Chart account. Instructions are located on the last page of this paperwork. If you have not heard from us regarding the results in 2 weeks, please contact this office.      Dyslipidemia Dyslipidemia is an imbalance of waxy, fat-like substances (lipids) in the blood. The body needs lipids in small amounts. Dyslipidemia often involves a high level of cholesterol or triglycerides, which are types of lipids. Common forms of dyslipidemia include:  High levels of bad cholesterol (LDL cholesterol). LDL is the type of cholesterol that causes fatty deposits (plaques) to build up in the blood vessels that carry blood away from your heart (arteries).  Low levels of good cholesterol (HDL cholesterol). HDL cholesterol is the type of cholesterol that protects against heart disease. High levels of HDL remove the LDL buildup from arteries.  High levels of triglycerides. Triglycerides are a fatty substance in the blood that is linked to a buildup of plaques in the arteries. You can develop dyslipidemia because of the genes you are born with (primary dyslipidemia) or changes that occur during your life (secondary dyslipidemia), or as a side effect of certain medical treatments. What are the causes? Primary dyslipidemia is caused by changes (mutations) in genes that are passed down through  families (inherited). These mutations cause several types of dyslipidemia. Mutations can result in disorders that make the body produce too much LDL cholesterol or triglycerides, or not enough HDL cholesterol. These disorders may lead to heart disease, arterial disease, or stroke at an early age. Causes of secondary dyslipidemia include certain lifestyle choices and diseases that lead to dyslipidemia, such as:  Eating a diet that is high in animal fat.  Not getting enough activity or exercise (having a sedentary lifestyle).  Having diabetes, kidney disease, liver disease, or thyroid disease.  Drinking large amounts of alcohol.  Using certain types of drugs. What increases the risk? You may be at greater risk for dyslipidemia if you are an older man or if you are a woman who has gone through menopause. Other risk factors include:  Having a family history of dyslipidemia.  Taking certain medicines, including birth control pills, steroids, some diuretics, beta-blockers, and some medicines forHIV.  Smoking cigarettes.  Eating a high-fat diet.  Drinking large amounts of alcohol.  Having certain medical conditions such as diabetes, polycystic ovary syndrome (PCOS), pregnancy, kidney disease, liver disease, or hypothyroidism.  Not exercising regularly.  Being overweight or obese with too much belly fat. What are the signs or symptoms? Dyslipidemia does not usually cause any symptoms. Very high lipid levels can cause fatty bumps under the skin (xanthomas) or a white or gray ring around the black center (pupil) of the eye. Very high triglyceride levels can cause inflammation of the pancreas (pancreatitis). How is this diagnosed? Your health care provider may diagnose dyslipidemia based on a routine blood test (fasting blood test). Because most people do not   have symptoms of the condition, this blood testing (lipid profile) is done on adults age 20 and older and is repeated every 5 years.  This test checks:  Total cholesterol. This is a measure of the total amount of cholesterol in your blood, including LDL cholesterol, HDL cholesterol, and triglycerides. A healthy number is below 200.  LDL cholesterol. The target number for LDL cholesterol is different for each person, depending on individual risk factors. For most people, a number below 100 is healthy. Ask your health care provider what your LDL cholesterol number should be.  HDL cholesterol. An HDL level of 60 or higher is best because it helps to protect against heart disease. A number below 40 for men or below 50 for women increases the risk for heart disease.  Triglycerides. A healthy triglyceride number is below 150. If your lipid profile is abnormal, your health care provider may do other blood tests to get more information about your condition. How is this treated? Treatment depends on the type of dyslipidemia that you have and your other risk factors for heart disease and stroke. Your health care provider will have a target range for your lipid levels based on this information. For many people, treatment starts with lifestyle changes, such as diet and exercise. Your health care provider may recommend that you:  Get regular exercise.  Make changes to your diet.  Quit smoking if you smoke. If diet changes and exercise do not help you reach your goals, your health care provider may also prescribe medicine to lower lipids. The most commonly prescribed type of medicine lowers your LDL cholesterol (statin drug). If you have a high triglyceride level, your provider may prescribe another type of drug (fibrate) or an omega-3 fish oil supplement, or both. Follow these instructions at home:  Take over-the-counter and prescription medicines only as told by your health care provider. This includes supplements.  Get regular exercise. Start an aerobic exercise and strength training program as told by your health care provider. Ask  your health care provider what activities are safe for you. Your health care provider may recommend:  30 minutes of aerobic activity 4-6 days a week. Brisk walking is an example of aerobic activity.  Strength training 2 days a week.  Eat a healthy diet as told by your health care provider. This can help you reach and maintain a healthy weight, lower your LDL cholesterol, and raise your HDL cholesterol. It may help to work with a diet and nutrition specialist (dietitian) to make a plan that is right for you. Your dietitian or health care provider may recommend:  Limiting your calories, if you are overweight.  Eating more fruits, vegetables, whole grains, fish, and lean meats.  Limiting saturated fat, trans fat, and cholesterol.  Follow instructions from your health care provider or dietitian about eating or drinking restrictions.  Limit alcohol intake to no more than one drink per day for nonpregnant women and two drinks per day for men. One drink equals 12 oz of beer, 5 oz of wine, or 1 oz of hard liquor.  Do not use any products that contain nicotine or tobacco, such as cigarettes and e-cigarettes. If you need help quitting, ask your health care provider.  Keep all follow-up visits as told by your health care provider. This is important. Contact a health care provider if:  You are having trouble sticking to your exercise or diet plan.  You are struggling to quit smoking or control your use of alcohol.   Summary  Dyslipidemia is an imbalance of waxy, fat-like substances (lipids) in the blood. The body needs lipids in small amounts. Dyslipidemia often involves a high level of cholesterol or triglycerides, which are types of lipids.  Treatment depends on the type of dyslipidemia that you have and your other risk factors for heart disease and stroke.  For many people, treatment starts with lifestyle changes, such as diet and exercise. Your health care provider may also prescribe medicine  to lower lipids. This information is not intended to replace advice given to you by your health care provider. Make sure you discuss any questions you have with your health care provider. Document Released: 05/17/2013 Document Revised: 01/07/2016 Document Reviewed: 01/07/2016 Elsevier Interactive Patient Education  2017 [employer] Inc[employer]  

## 2016-05-07 NOTE — Progress Notes (Signed)
Chief Complaint  Patient presents with  . Annual Exam    Subjective:  Jeremy Blair is a 55 y.o. male here for a health maintenance visit.  Patient is established pt  Patient Active Problem List   Diagnosis Date Noted  . Hyperlipidemia   . Abnormal EKG   . Anxiety     Past Medical History:  Diagnosis Date  . Abnormal EKG   . High cholesterol     History reviewed. No pertinent surgical history.   Outpatient Medications Prior to Visit  Medication Sig Dispense Refill  . aspirin 81 MG tablet Take 81 mg by mouth daily.      Marland Kitchen atorvastatin (LIPITOR) 40 MG tablet Take 1 tablet (40 mg total) by mouth daily at 6 PM. Please call and schedule an appointment for further refills 15 tablet 0  . buPROPion (WELLBUTRIN XL) 150 MG 24 hr tablet Take 300 mg by mouth daily.     . Multiple Vitamin (MULTIVITAMIN) tablet Take 1 tablet by mouth daily.      Marland Kitchen FLUVIRIN SUSP Inject as directed once.  0  . omega-3 acid ethyl esters (LOVAZA) 1 g capsule TAKE 1 CAPSULE BY MOUTH THREE TIMES DAILY 270 capsule 3   No facility-administered medications prior to visit.     No Known Allergies   Family History  Problem Relation Age of Onset  . Leukemia Father     deceased age 51  . Coronary artery disease Father     CABG/AAA/Stents     Health Habits: Dental Exam: up to date Eye Exam: up to date Exercise: 5 times/week on average Current exercise activities: walking/running Diet: balanced  Social History   Social History  . Marital status: Single    Spouse name: N/A  . Number of children: N/A  . Years of education: N/A   Occupational History  . Not on file.   Social History Main Topics  . Smoking status: Former Smoker    Quit date: 03/06/2006  . Smokeless tobacco: Never Used  . Alcohol use Yes  . Drug use: Unknown  . Sexual activity: Not on file   Other Topics Concern  . Not on file   Social History Narrative  . No narrative on file   History  Alcohol Use  . Yes    History  Smoking Status  . Former Smoker  . Quit date: 03/06/2006  Smokeless Tobacco  . Never Used   History  Drug use: Unknown     Health Maintenance: See under health Maintenance activity for review of completion dates as well. Immunization History  Administered Date(s) Administered  . Tdap 05/07/2016      Depression Screen-PHQ2/9 Depression screen PHQ 2/9 05/07/2016  Decreased Interest 0  Down, Depressed, Hopeless 0  PHQ - 2 Score 0      Depression Severity and Treatment Recommendations:  0-4= None  5-9= Mild / Treatment: Support, educate to call if worse; return in one month  10-14= Moderate / Treatment: Support, watchful waiting; Antidepressant or Psycotherapy  15-19= Moderately severe / Treatment: Antidepressant OR Psychotherapy  >= 20 = Major depression, severe / Antidepressant AND Psychotherapy    Review of Systems   Review of Systems  Constitutional: Negative for chills, fever and weight loss.  HENT: Negative for ear discharge, ear pain, nosebleeds and tinnitus.   Respiratory: Negative for cough and wheezing.   Cardiovascular: Negative for chest pain, palpitations and leg swelling.  Gastrointestinal: Negative for abdominal pain, nausea and vomiting.  Genitourinary: Negative  for dysuria, frequency and urgency.  Musculoskeletal: Negative for back pain, myalgias and neck pain.  Skin: Negative for itching and rash.  Neurological: Negative for dizziness, tingling, tremors and headaches.  Psychiatric/Behavioral: Negative for depression. The patient does not have insomnia.     See HPI for ROS as well.    Objective:   Vitals:   05/07/16 1353  BP: 132/74  Pulse: 90  Resp: 16  Temp: 97.7 F (36.5 C)  TempSrc: Oral  SpO2: 97%  Weight: 177 lb 9.6 oz (80.6 kg)  Height:  (1.753 m)    Body mass index is 26.23 kg/m.  Physical Exam  Constitutional: He is oriented to person, place, and time. He appears well-developed and well-nourished.   HENT:  Head: Normocephalic and atraumatic.  Right Ear: External ear normal.  Left Ear: External ear normal.  Eyes: Conjunctivae and EOM are normal. Pupils are equal, round, and reactive to light.  Neck: Normal range of motion. Neck supple.  Cardiovascular: Normal rate, regular rhythm and normal heart sounds.   No murmur heard. Pulmonary/Chest: Effort normal and breath sounds normal. No respiratory distress. He has no wheezes. He has no rales.  Abdominal: Soft. Bowel sounds are normal. He exhibits no distension. There is no tenderness.  Musculoskeletal: Normal range of motion. He exhibits no edema.  Neurological: He is alert and oriented to person, place, and time. No cranial nerve deficit.  Skin: Skin is warm. No erythema.  Psychiatric: He has a normal mood and affect. His behavior is normal. Judgment and thought content normal.        Assessment/Plan:   Patient was seen for a health maintenance exam.  Counseled the patient on health maintenance issues. Reviewed her health mainteance schedule and ordered appropriate tests (see orders.) Counseled on regular exercise and weight management. Recommend regular eye exams and dental cleaning.   The following issues were addressed today for health maintenance:   Famous was seen today for annual exam.  Diagnoses and all orders for this visit:  Impaired fasting glucose- discussed that a previous glucose was elevated  Will screen with a1c -     Hemoglobin A1c  Health maintenance examination- routine age appropriate screenings  Need for hepatitis C screening test- advised routine screening -     HCV Ab w/Rflx to Verification  Other orders -     Tdap vaccine greater than or equal to 7yo IM -     Interpretation:    No Follow-up on file.    Body mass index is 26.23 kg/m.:  Discussed the patient's BMI with patient. The BMI body mass index is 26.23 kg/m.     No future appointments.  Patient Instructions       IF you  received an x-ray today, you will receive an invoice from [facility]. Please contact [facility] at [phone] with questions or concerns regarding your invoice.   IF you received labwork today, you will receive an invoice from [employer]. Please contact [facility] at [phone] with questions or concerns regarding your invoice.   Our billing staff will not be able to assist you with questions regarding bills from these companies.  You will be contacted with the lab results as soon as they are available. The fastest way to get your results is to activate your My Chart account. Instructions are located on the last page of this paperwork. If you have not heard from [place] regarding the results in 2 weeks, please contact this office.  Dyslipidemia Dyslipidemia is an imbalance of waxy, fat-like substances (lipids) in the blood. The body needs lipids in small amounts. Dyslipidemia often involves a high level of cholesterol or triglycerides, which are types of lipids. Common forms of dyslipidemia include:  High levels of bad cholesterol (LDL cholesterol). LDL is the type of cholesterol that causes fatty deposits (plaques) to build up in the blood vessels that carry blood away from your heart (arteries).  Low levels of good cholesterol (HDL cholesterol). HDL cholesterol is the type of cholesterol that protects against heart disease. High levels of HDL remove the LDL buildup from arteries.  High levels of triglycerides. Triglycerides are a fatty substance in the blood that is linked to a buildup of plaques in the arteries. You can develop dyslipidemia because of the genes you are born with (primary dyslipidemia) or changes that occur during your life (secondary dyslipidemia), or as a side effect of certain medical treatments. What are the causes? Primary dyslipidemia is caused by changes (mutations) in genes that are passed down through families  (inherited). These mutations cause several types of dyslipidemia. Mutations can result in disorders that make the body produce too much LDL cholesterol or triglycerides, or not enough HDL cholesterol. These disorders may lead to heart disease, arterial disease, or stroke at an early age. Causes of secondary dyslipidemia include certain lifestyle choices and diseases that lead to dyslipidemia, such as:  Eating a diet that is high in animal fat.  Not getting enough activity or exercise (having a sedentary lifestyle).  Having diabetes, kidney disease, liver disease, or thyroid disease.  Drinking large amounts of alcohol.  Using certain types of drugs. What increases the risk? You may be at greater risk for dyslipidemia if you are an older man or if you are a woman who has gone through menopause. Other risk factors include:  Having a family history of dyslipidemia.  Taking certain medicines, including birth control pills, steroids, some diuretics, beta-blockers, and some medicines forHIV.  Smoking cigarettes.  Eating a high-fat diet.  Drinking large amounts of alcohol.  Having certain medical conditions such as diabetes, polycystic ovary syndrome (PCOS), pregnancy, kidney disease, liver disease, or hypothyroidism.  Not exercising regularly.  Being overweight or obese with too much belly fat. What are the signs or symptoms? Dyslipidemia does not usually cause any symptoms. Very high lipid levels can cause fatty bumps under the skin (xanthomas) or a white or gray ring around the black center (pupil) of the eye. Very high triglyceride levels can cause inflammation of the pancreas (pancreatitis). How is this diagnosed? Your health care provider may diagnose dyslipidemia based on a routine blood test (fasting blood test). Because most people do not have symptoms of the condition, this blood testing (lipid profile) is done on adults age 57 and older and is repeated every 5 years. This test  checks:  Total cholesterol. This is a measure of the total amount of cholesterol in your blood, including LDL cholesterol, HDL cholesterol, and triglycerides. A healthy number is below 200.  LDL cholesterol. The target number for LDL cholesterol is different for each person, depending on individual risk factors. For most people, a number below 100 is healthy. Ask your health care provider what your LDL cholesterol number should be.  HDL cholesterol. An HDL level of 60 or higher is best because it helps to protect against heart disease. A number below 40 for men or below 50 for women increases the risk for heart disease.  Triglycerides. A healthy triglyceride  number is below 150. If your lipid profile is abnormal, your health care provider may do other blood tests to get more information about your condition. How is this treated? Treatment depends on the type of dyslipidemia that you have and your other risk factors for heart disease and stroke. Your health care provider will have a target range for your lipid levels based on this information. For many people, treatment starts with lifestyle changes, such as diet and exercise. Your health care provider may recommend that you:  Get regular exercise.  Make changes to your diet.  Quit smoking if you smoke. If diet changes and exercise do not help you reach your goals, your health care provider may also prescribe medicine to lower lipids. The most commonly prescribed type of medicine lowers your LDL cholesterol (statin drug). If you have a high triglyceride level, your provider may prescribe another type of drug (fibrate) or an omega-3 fish oil supplement, or both. Follow these instructions at home:  Take over-the-counter and prescription medicines only as told by your health care provider. This includes supplements.  Get regular exercise. Start an aerobic exercise and strength training program as told by your health care provider. Ask your health  care provider what activities are safe for you. Your health care provider may recommend:  30 minutes of aerobic activity 4-6 days a week. Brisk walking is an example of aerobic activity.  Strength training 2 days a week.  Eat a healthy diet as told by your health care provider. This can help you reach and maintain a healthy weight, lower your LDL cholesterol, and raise your HDL cholesterol. It may help to work with a diet and nutrition specialist (dietitian) to make a plan that is right for you. Your dietitian or health care provider may recommend:  Limiting your calories, if you are overweight.  Eating more fruits, vegetables, whole grains, fish, and lean meats.  Limiting saturated fat, trans fat, and cholesterol.  Follow instructions from your health care provider or dietitian about eating or drinking restrictions.  Limit alcohol intake to no more than one drink per day for nonpregnant women and two drinks per day for men. One drink equals 12 oz of beer, 5 oz of wine, or 1 oz of hard liquor.  Do not use any products that contain nicotine or tobacco, such as cigarettes and e-cigarettes. If you need help quitting, ask your health care provider.  Keep all follow-up visits as told by your health care provider. This is important. Contact a health care provider if:  You are having trouble sticking to your exercise or diet plan.  You are struggling to quit smoking or control your use of alcohol. Summary  Dyslipidemia is an imbalance of waxy, fat-like substances (lipids) in the blood. The body needs lipids in small amounts. Dyslipidemia often involves a high level of cholesterol or triglycerides, which are types of lipids.  Treatment depends on the type of dyslipidemia that you have and your other risk factors for heart disease and stroke.  For many people, treatment starts with lifestyle changes, such as diet and exercise. Your health care provider may also prescribe medicine to lower  lipids. This information is not intended to replace advice given to you by your health care provider. Make sure you discuss any questions you have with your health care provider. Document Released: 05/17/2013 Document Revised: 01/07/2016 Document Reviewed: 01/07/2016 Elsevier Interactive Patient Education  2017 [employer].

## 2016-05-08 LAB — HEMOGLOBIN A1C
ESTIMATED AVERAGE GLUCOSE: 114 mg/dL
HEMOGLOBIN A1C: 5.6 % (ref 4.8–5.6)

## 2016-05-08 LAB — HCV AB W/RFLX TO VERIFICATION

## 2016-05-08 LAB — HCV INTERPRETATION

## 2016-05-16 ENCOUNTER — Other Ambulatory Visit: Payer: Self-pay | Admitting: Nurse Practitioner

## 2016-05-16 MED ORDER — FENOFIBRATE 160 MG PO TABS: 160.0000 mg | ORAL_TABLET | Freq: Every day | ORAL | 11 refills | Status: DC

## 2016-05-16 NOTE — Progress Notes (Signed)
Received notification from patient's pharmacy that patient's insurance company will no longer cover lovaza.  Sending note to Dr. Elease HashimotoNahser for approval to switch patient to fenofibrate 160 mg which is covered.

## 2016-05-20 ENCOUNTER — Telehealth: Payer: Self-pay

## 2016-05-20 NOTE — Telephone Encounter (Signed)
Omega-3 Acid 1 gm caps approved by [employer]. Case # [id number] and is valid through 05/20/2017.

## 2016-05-27 ENCOUNTER — Other Ambulatory Visit: Payer: Self-pay | Admitting: Cardiovascular Disease

## 2016-07-02 ENCOUNTER — Encounter: Payer: Self-pay | Admitting: Internal Medicine

## 2017-03-02 ENCOUNTER — Telehealth: Payer: Self-pay | Admitting: Cardiovascular Disease

## 2017-03-02 DIAGNOSIS — E782 Mixed hyperlipidemia: Secondary | ICD-10-CM

## 2017-03-02 NOTE — Telephone Encounter (Signed)
New Message  Pt would like to get lab orders in the system to schedule for 10/13. Please call back to discuss

## 2017-03-02 NOTE — Telephone Encounter (Signed)
Left detailed message for patient to call back to schedule lab appointment prior to appointment with Dr. Elease Hashimoto.

## 2017-05-05 ENCOUNTER — Other Ambulatory Visit: Payer: BLUE CROSS/BLUE SHIELD

## 2017-05-06 ENCOUNTER — Other Ambulatory Visit: Payer: BLUE CROSS/BLUE SHIELD

## 2017-05-08 ENCOUNTER — Ambulatory Visit: Payer: BLUE CROSS/BLUE SHIELD | Admitting: Cardiovascular Disease

## 2019-01-25 ENCOUNTER — Other Ambulatory Visit: Payer: Self-pay | Admitting: Family Medicine

## 2019-01-25 ENCOUNTER — Ambulatory Visit
Admission: RE | Admit: 2019-01-25 | Discharge: 2019-01-25 | Disposition: A | Discharge status: Home / Self Care | Payer: BLUE CROSS/BLUE SHIELD | Source: Ambulatory Visit | Attending: Family Medicine | Admitting: Family Medicine

## 2019-01-25 DIAGNOSIS — M5412 Radiculopathy, cervical region: Secondary | ICD-10-CM

## 2019-02-04 ENCOUNTER — Other Ambulatory Visit (HOSPITAL_COMMUNITY): Payer: Self-pay | Admitting: Family Medicine

## 2019-02-04 ENCOUNTER — Ambulatory Visit (HOSPITAL_COMMUNITY)
Admission: RE | Admit: 2019-02-04 | Discharge: 2019-02-04 | Disposition: A | Discharge status: Home / Self Care | Payer: BC Managed Care – PPO | Source: Ambulatory Visit | Attending: Cardiology | Admitting: Cardiology

## 2019-02-04 ENCOUNTER — Other Ambulatory Visit: Payer: Self-pay

## 2019-02-04 DIAGNOSIS — I6523 Occlusion and stenosis of bilateral carotid arteries: Secondary | ICD-10-CM

## 2019-06-03 ENCOUNTER — Ambulatory Visit: Payer: BC Managed Care – PPO | Admitting: Cardiovascular Disease

## 2019-06-07 ENCOUNTER — Ambulatory Visit: Payer: BC Managed Care – PPO | Admitting: Cardiovascular Disease

## 2019-07-05 ENCOUNTER — Ambulatory Visit: Payer: BC Managed Care – PPO | Admitting: Cardiovascular Disease

## 2019-07-18 ENCOUNTER — Other Ambulatory Visit: Payer: Self-pay

## 2019-07-18 ENCOUNTER — Ambulatory Visit (INDEPENDENT_AMBULATORY_CARE_PROVIDER_SITE_OTHER): Payer: BC Managed Care – PPO | Admitting: Cardiovascular Disease

## 2019-07-18 ENCOUNTER — Encounter: Payer: Self-pay | Admitting: Cardiovascular Disease

## 2019-07-18 VITALS — BP 124/74 | HR 71 | Ht 69.0 in | Wt 170.0 lb

## 2019-07-18 DIAGNOSIS — E782 Mixed hyperlipidemia: Secondary | ICD-10-CM

## 2019-07-18 DIAGNOSIS — I6523 Occlusion and stenosis of bilateral carotid arteries: Secondary | ICD-10-CM

## 2019-07-18 NOTE — Progress Notes (Signed)
Jeremy Blair Date of Birth  1961-04-25 [place]  [address]    [place]   [place], [place]  [address]    [place], [place]  [address] [phone]  Fax  [phone]  [phone]  Fax [phone]  Problem list:  1. Hypercholesterolemia   History of Present Illness:  Jeremy Blair is a 59 yo with hx of hyperlipidemia.  He is having some foot problems and is scheduled to see a podiatrist.  He thinks he may have some gout.  He has tried prednisone but it did not seem to help.   He has not had any cardiac problems  Feb. 5, 2014:  Oct 12, 2013: Jeremy Blair is doing ok.   He stopped his Niacin several months ago due to itching ( different from the flushing)   Labs from 2 days ago look good.   Sept. 8, 2016:  Doing well.  Not exercising much .  Lipids recently look great .   Dec. 8, 2017:  No CP or dyspnea.  Not exercising as much.   Not enough time .  Is a English as a second language teacher for [employer] .  Has gained some weight.   February 22nd, 2021:  Jeremy Blair is seen today for follow-up visit of his hyperlipidemia.  He was last seen 3 years ago. Was sent back over by Rosendo Gros for further evaluation of his  carotid stenosis .   He had a carotid duplex scan which reveals mild bilateral carotid artery disease.  His recent labs with Dr. Darron Doom look good. Dr,. Darron Doom  has been following his lipids recently. LDL has been around 60.   Trigs are up and down . No CP , no dyspnea      Current Outpatient Medications on File Prior to Visit  Medication Sig Dispense Refill  . aspirin 81 MG tablet Take 81 mg by mouth daily.      Marland Kitchen atorvastatin (LIPITOR) 40 MG tablet TAKE 1 TABLET(40 MG) BY MOUTH DAILY AT 6 PM 90 tablet 3  . buPROPion (WELLBUTRIN XL) 150 MG 24 hr tablet Take 300 mg by mouth daily.     . Multiple Vitamin (MULTIVITAMIN) tablet Take 1 tablet by mouth daily.      . tamsulosin (FLOMAX) 0.4 MG CAPS capsule Take 0.4 mg by mouth  daily.     No current facility-administered medications on file prior to visit.    No Known Allergies  Past Medical History:  Diagnosis Date  . Abnormal EKG   . High cholesterol     History reviewed. No pertinent surgical history.  Social History   Tobacco Use  Smoking Status Former Smoker  . Quit date: 03/06/2006  . Years since quitting: 13.3  Smokeless Tobacco Never Used    Social History   Substance and Sexual Activity  Alcohol Use Yes    Family History  Problem Relation Age of Onset  . Leukemia Father        deceased age 35  . Coronary artery disease Father        CABG/AAA/Stents    Reviw of Systems:  Reviewed in the HPI.  All other systems are negative.  Physical Exam: Blood pressure 124/74, pulse 71, height 5\' 9"  (1.753 m), weight 170 lb (77.1 kg), SpO2 95 %.  GEN:  Well nourished, well developed in no acute distress HEENT: Normal NECK: No JVD; No carotid bruits LYMPHATICS: No lymphadenopathy CARDIAC: RRR , no murmurs,  rubs, gallops RESPIRATORY:  Clear to auscultation without rales, wheezing or rhonchi  ABDOMEN: Soft, non-tender, non-distended MUSCULOSKELETAL:  No edema; No deformity  SKIN: Warm and dry NEUROLOGIC:  Alert and oriented x 3   ECG: July 18, 2019: Normal sinus rhythm at 71.  No ST or T wave changes.   Assessment / Plan:   1. Hypercholesterolemia-   Labs look great.  LDL is in the 60s.   Managed by Dr. Hal Hope.   2.  Carotid stenosis:  Has very mild bilateral carotid stenosis.  1-39% .    I'll see him in 2 years.  Will get a carotid duplex study just prior to the office visit   Kristeen Miss, MD  07/18/2019 12:10 PM    [facility] [address] [place],  [address] [place], [place]  [address] Pager [phone] Phone: [phone]; Fax: [phone]   [facility]  [address] [place], [place]  [address] [phone]   Fax [phone]

## 2019-07-18 NOTE — Patient Instructions (Signed)
Medication Instructions:  Your physician recommends that you continue on your current medications as directed. Please refer to the Current Medication list given to you today.  *If you need a refill on your cardiac medications before your next appointment, please call your pharmacy*  Lab Work: None Ordered If you have labs (blood work) drawn today and your tests are completely normal, you will receive your results only by: Marland Kitchen MyChart Message (if you have MyChart) OR . A paper copy in the mail If you have any lab test that is abnormal or we need to change your treatment, we will call you to review the results.   Testing/Procedures: Your physician has requested that you have a carotid duplex in 2 years prior to your appointment. This test is an ultrasound of the carotid arteries in your neck. It looks at blood flow through these arteries that supply the brain with blood. Allow one hour for this exam. There are no restrictions or special instructions.    Follow-Up: At [facility], you and your health needs are our priority.  As part of our continuing mission to provide you with exceptional heart care, we have created designated Provider Care Teams.  These Care Teams include your primary Cardiologist (physician) and Advanced Practice Providers (APPs -  Physician Assistants and Nurse Practitioners) who all work together to provide you with the care you need, when you need it.  Your next appointment:   2 year(s)  The format for your next appointment:   In Person  Provider:   You may see Kristeen Miss, MD or one of the following Advanced Practice Providers on your designated Care Team:    Tereso Newcomer, PA-C  Vin Eros, [place]  Berton Bon, [place]

## 2020-02-10 ENCOUNTER — Other Ambulatory Visit: Payer: Self-pay

## 2020-02-10 ENCOUNTER — Other Ambulatory Visit: Payer: Self-pay | Admitting: Family Medicine

## 2020-02-10 ENCOUNTER — Ambulatory Visit
Admission: RE | Admit: 2020-02-10 | Discharge: 2020-02-10 | Disposition: A | Discharge status: Home / Self Care | Payer: BC Managed Care – PPO | Source: Ambulatory Visit | Attending: Family Medicine | Admitting: Family Medicine

## 2020-02-10 DIAGNOSIS — M79642 Pain in left hand: Secondary | ICD-10-CM

## 2020-02-10 DIAGNOSIS — M25532 Pain in left wrist: Secondary | ICD-10-CM

## 2020-02-23 ENCOUNTER — Other Ambulatory Visit: Payer: Self-pay

## 2020-02-23 ENCOUNTER — Ambulatory Visit (HOSPITAL_COMMUNITY)
Admission: RE | Admit: 2020-02-23 | Discharge: 2020-02-23 | Disposition: A | Discharge status: Home / Self Care | Payer: BC Managed Care – PPO | Source: Ambulatory Visit | Attending: Cardiology | Admitting: Cardiology

## 2020-02-23 DIAGNOSIS — I6523 Occlusion and stenosis of bilateral carotid arteries: Secondary | ICD-10-CM

## 2021-08-25 IMAGING — CR DG WRIST COMPLETE 3+V*L*
4 series · 4 of 4 positions shown · non-contrast
Comparison: None.

CLINICAL DATA: Fell 6 weeks ago, left hand and wrist pain greatest
at distal radius and thumb

EXAM:
LEFT HAND - COMPLETE 3+ VIEW; LEFT WRIST - COMPLETE 3+ VIEW

[x wrist pa left]
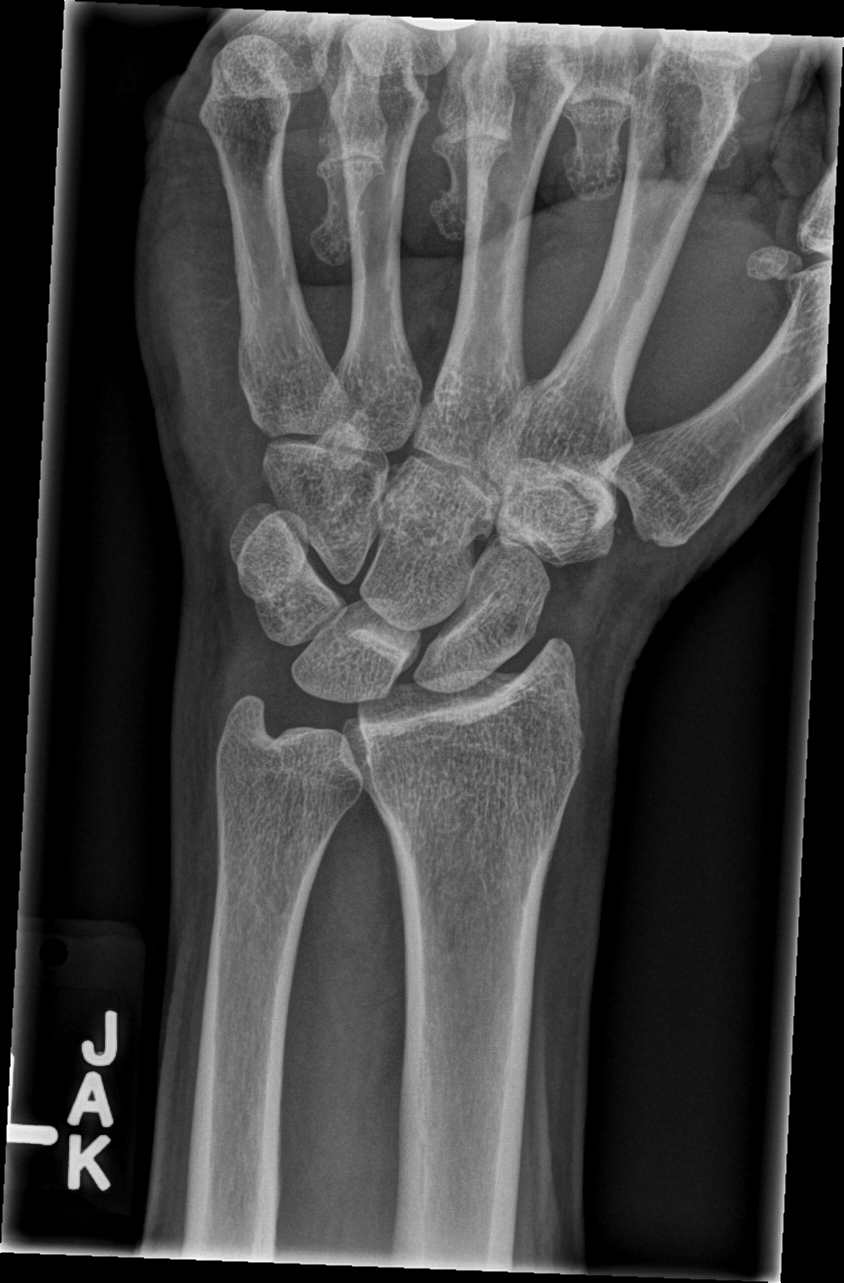

[x wrist obl left]
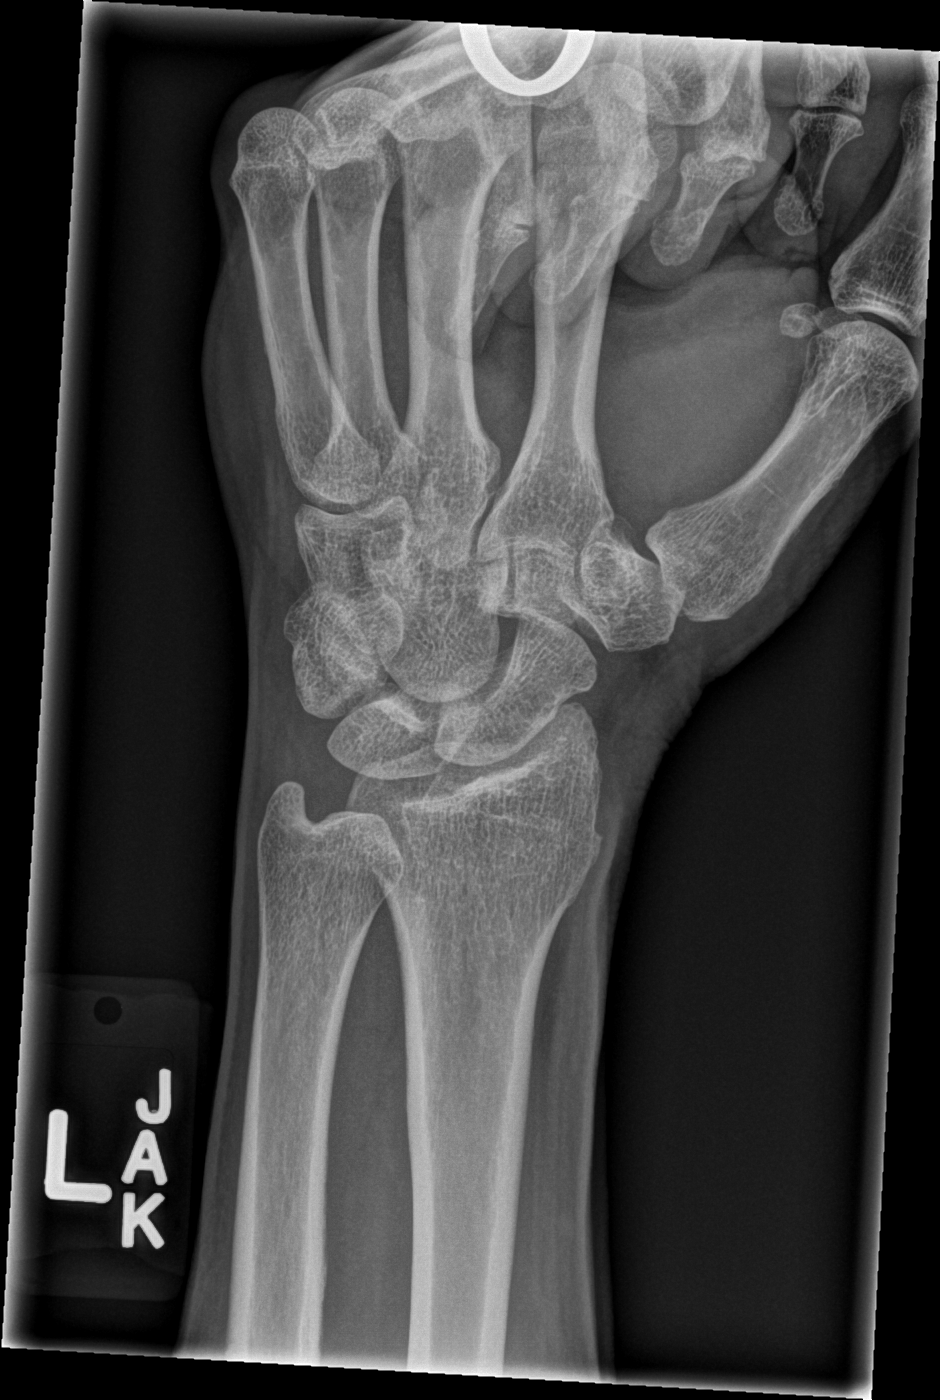

[x wrist lat left (1 of 2)]
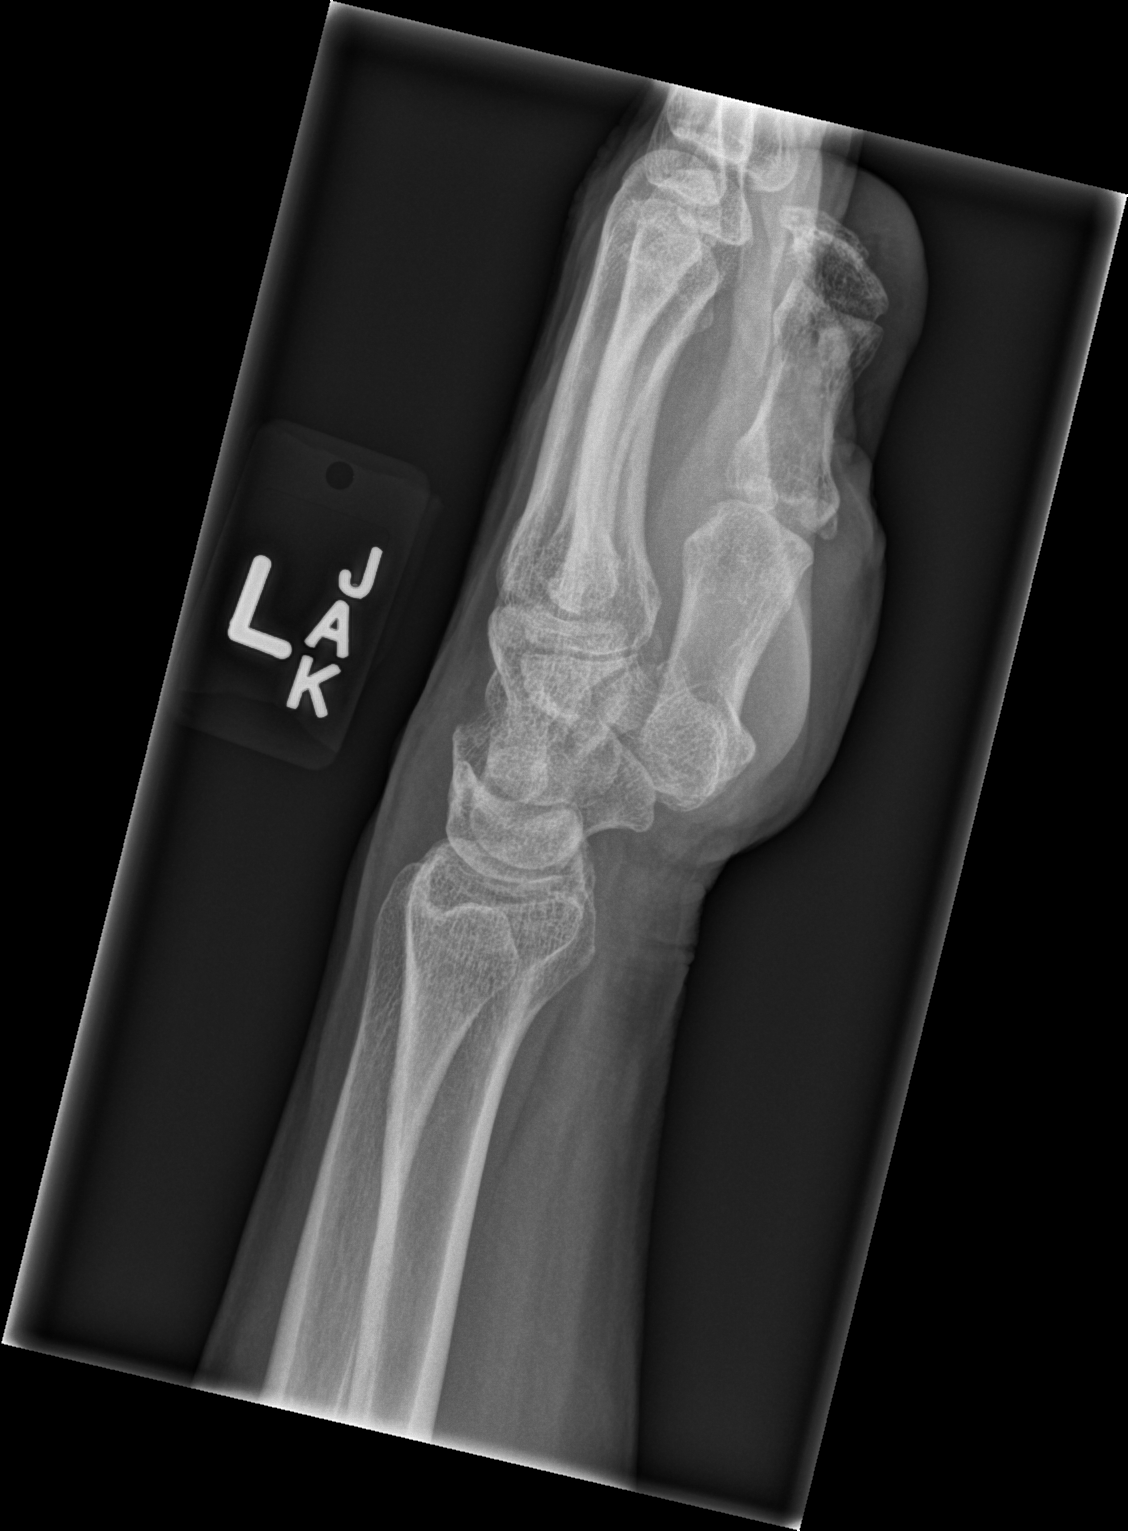

[x wrist lat left (2 of 2)]
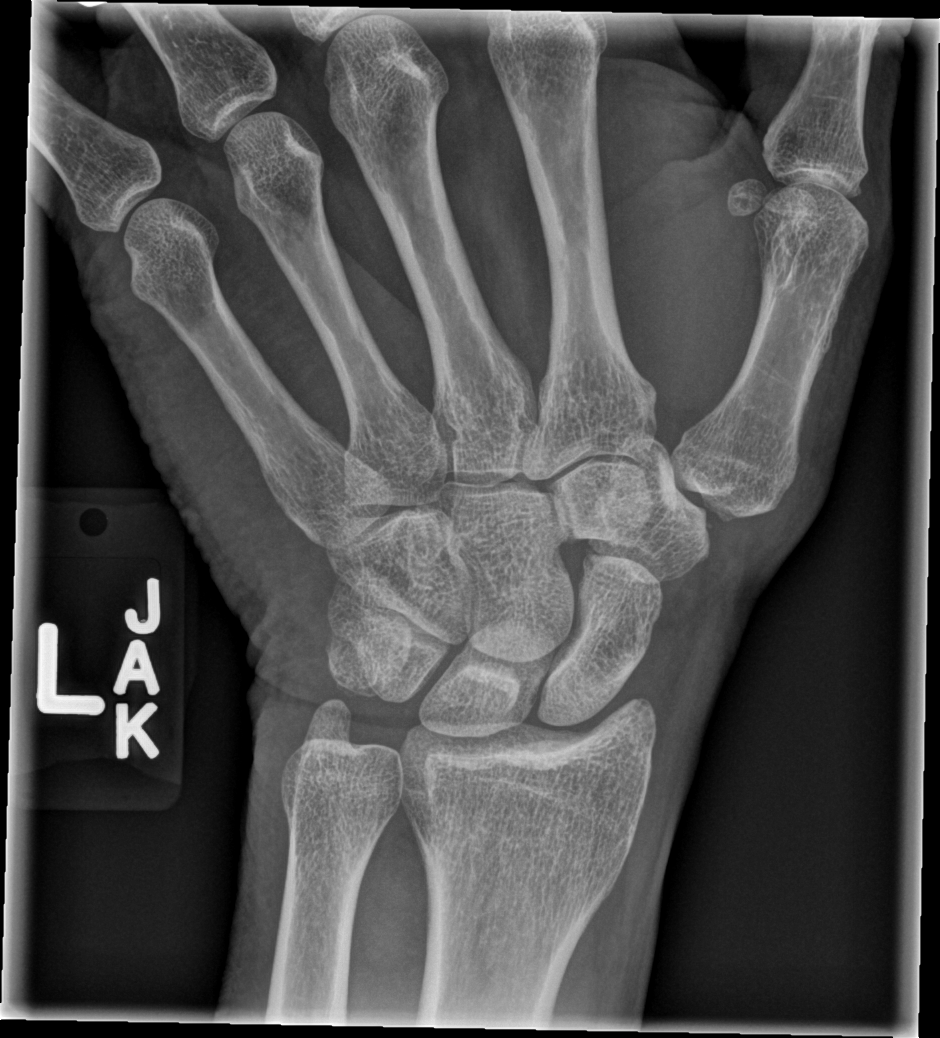

[4 of 4 positions shown; findings below may reference images not displayed]

FINDINGS: Left hand: Frontal, oblique, and lateral views demonstrate no
fracture, subluxation, or dislocation. Mild diffuse interphalangeal
joint space narrowing compatible with osteoarthritis. The soft
tissues are unremarkable.

Left wrist: Frontal, oblique, lateral, and ulnar deviated views are
obtained. There is mild joint space narrowing and osteophyte
formation at the first carpometacarpal joint. No fracture,
subluxation, or dislocation. Joint spaces are otherwise well
preserved. Soft tissues are normal.
IMPRESSION: 1. Mild osteoarthritis.
2. No acute fracture within the left hand or left wrist.

## 2021-08-28 ENCOUNTER — Ambulatory Visit: Payer: BC Managed Care – PPO | Admitting: Cardiovascular Disease

## 2021-09-23 ENCOUNTER — Encounter: Payer: Self-pay | Admitting: Cardiovascular Disease

## 2021-09-23 ENCOUNTER — Ambulatory Visit (INDEPENDENT_AMBULATORY_CARE_PROVIDER_SITE_OTHER): Payer: BC Managed Care – PPO | Admitting: Cardiovascular Disease

## 2021-09-23 VITALS — BP 130/76 | HR 67 | Ht 69.0 in | Wt 172.8 lb

## 2021-09-23 DIAGNOSIS — E782 Mixed hyperlipidemia: Secondary | ICD-10-CM

## 2021-09-23 DIAGNOSIS — I6523 Occlusion and stenosis of bilateral carotid arteries: Secondary | ICD-10-CM

## 2021-09-23 NOTE — Patient Instructions (Signed)
Medication Instructions:  ?Your physician recommends that you continue on your current medications as directed. Please refer to the Current Medication list given to you today. ? ?*If you need a refill on your cardiac medications before your next appointment, please call your pharmacy* ? ? ?Lab Work: ?None ?If you have labs (blood work) drawn today and your tests are completely normal, you will receive your results only by: ?MyChart Message (if you have MyChart) OR ?A paper copy in the mail ?If you have any lab test that is abnormal or we need to change your treatment, we will call you to review the results. ? ? ?Testing/Procedures: ?Your physician has requested that you have a carotid duplex. This test is an ultrasound of the carotid arteries in your neck. It looks at blood flow through these arteries that supply the brain with blood. Allow one hour for this exam. There are no restrictions or special instructions. ? ?Follow-Up: ?At [facility], you and your health needs are our priority.  As part of our continuing mission to provide you with exceptional heart care, we have created designated Provider Care Teams.  These Care Teams include your primary Cardiologist (physician) and Advanced Practice Providers (APPs -  Physician Assistants and Nurse Practitioners) who all work together to provide you with the care you need, when you need it. ? ?Your next appointment:   ?1 year(s) ? ?The format for your next appointment:   ?In Person ? ?Provider:   ?Kristeen Miss, MD  ?  ?

## 2021-09-23 NOTE — Progress Notes (Signed)
? ? ? ?Jeremy Blair ?Date of Birth  13-May-1961 ?Architectural technologist     [place] ? ?[address]    [address]   [address] ?[place], [place]  [address]    [place], [place]  [address] ?[phone]  Fax  [phone]  [phone]  Fax [phone] ? ?Problem list:  ?1. Hypercholesterolemia ? ? ?History of Present Illness: ? ?Jeremy Blair is a 61 yo with hx of hyperlipidemia.  He is having some foot problems and is scheduled to see a podiatrist.  He thinks he may have some gout.  He has tried prednisone but it did not seem to help.   He has not had any cardiac problems ? ?Feb. 5, 2014: ? ?Oct 12, 2013: ?Jeremy Blair is doing ok.   ?He stopped his Niacin several months ago due to itching ( different from the flushing)   ?Labs from 2 days ago look good.  ? ?Sept. 8, 2016: ? ?Doing well.  Not exercising much .  ?Lipids recently look great .  ? ?Dec. 8, 2017: ? ?No CP or dyspnea.  ?Not exercising as much.   Not enough time .  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  ?Has gained some weight.  ? ?February 22nd, 2021: ? ?Jeremy Blair is seen today for follow-up visit of his hyperlipidemia.  He was last seen 3 years ago. ?Was sent back over by Willeen Cass for further evaluation of his  carotid stenosis .  ? ?He had a carotid duplex scan which reveals mild bilateral carotid artery disease.  His recent labs with Dr. Hal Hope look good. ?Dr,. Hal Hope  has been following his lipids recently. ?LDL has been around 60.   ?Trigs are up and down . ?No CP , no dyspnea  ? ?Sep 23, 2021: ?Jeremy Blair is seen today for follow-up visit.  He has a history of hyperlipidemia.  He has very mild carotid artery disease. ?Retired from [employer] last year. (37 years)  ?Has been traveling  ?No Cp , no dyspena  ?Lipids are monitored  by Dr. Hal Hope  ?Advised him to watch his carbs .  ? ?Current Outpatient Medications on File Prior to Visit  ?Medication Sig Dispense Refill  ? atorvastatin (LIPITOR) 40 MG tablet TAKE 1 TABLET(40 MG) BY MOUTH DAILY AT 6 PM 90  tablet 3  ? buPROPion (WELLBUTRIN XL) 300 MG 24 hr tablet Take 300 mg by mouth daily.    ? Multiple Vitamin (MULTIVITAMIN) tablet Take 1 tablet by mouth daily.      ? tamsulosin (FLOMAX) 0.4 MG CAPS capsule Take 0.4 mg by mouth daily.    ? aspirin 81 MG tablet Take 81 mg by mouth daily.   (Patient not taking: Reported on 09/23/2021)    ? ?No current facility-administered medications on file prior to visit.  ? ? ?No Known Allergies ? ?Past Medical History:  ?Diagnosis Date  ? Abnormal EKG   ? High cholesterol   ? ? ?History reviewed. No pertinent surgical history. ? ?Social History  ? ?Tobacco Use  ?Smoking Status Former  ? Types: Cigarettes  ? Quit date: 03/06/2006  ? Years since quitting: 15.5  ?Smokeless Tobacco Never  ? ? ?Social History  ? ?Substance and Sexual Activity  ?Alcohol Use Yes  ? ? ?Family History  ?Problem Relation Age of Onset  ? Leukemia Father   ?     deceased age 71  ? Coronary artery disease Father   ?     CABG/AAA/Stents  ? ? ?  Reviw of Systems:  ?Reviewed in the HPI.  All other systems are negative. ? ?Physical Exam: ?Blood pressure 130/76, pulse 67, height 5\' 9"  (1.753 m), weight 172 lb 12.8 oz (78.4 kg), SpO2 96 %. ? ?GEN:  Well nourished, well developed in no acute distress ?HEENT: Normal ?NECK: No JVD; No carotid bruits ?LYMPHATICS: No lymphadenopathy ?CARDIAC: RRR , no murmurs, rubs, gallops ?RESPIRATORY:  Clear to auscultation without rales, wheezing or rhonchi  ?ABDOMEN: Soft, non-tender, non-distended ?MUSCULOSKELETAL:  No edema; No deformity  ?SKIN: Warm and dry ?NEUROLOGIC:  Alert and oriented x 3 ? ? ? ?ECG: Sep 23, 2021: Normal sinus rhythm at 67.  No ST or T wave changes. ?  ? ? ?Assessment / Plan:  ? ?1. Hypercholesterolemia-    labs have been managed by Dr. Sep 25, 2021.  Continue current medications. ? ?2.  Carotid stenosis:   ?We will repeat his carotid duplex. ?We will see him again in 1 year either with an APP or me. ? ? ?Hal Hope, MD  ?09/23/2021 10:32 AM    ?[facility] ?[address] [place],  [address] ?[place], [place]  [place] ?Pager [phone] ?Phone: [phone]; Fax: [phone]  ? ?[place] [facility]  ?[address] ?[address] ?[place], [place]  [place] ?[phone]   Fax [phone] ? ?

## 2021-09-30 ENCOUNTER — Ambulatory Visit (HOSPITAL_COMMUNITY)
Admission: RE | Admit: 2021-09-30 | Discharge: 2021-09-30 | Disposition: A | Discharge status: Home / Self Care | Payer: BC Managed Care – PPO | Source: Ambulatory Visit | Attending: Internal Medicine | Admitting: Internal Medicine

## 2021-09-30 DIAGNOSIS — E782 Mixed hyperlipidemia: Secondary | ICD-10-CM | POA: Diagnosis present

## 2021-09-30 DIAGNOSIS — I6523 Occlusion and stenosis of bilateral carotid arteries: Secondary | ICD-10-CM | POA: Insufficient documentation

## 2022-12-21 ENCOUNTER — Encounter: Payer: Self-pay | Admitting: Cardiovascular Disease

## 2022-12-21 NOTE — Progress Notes (Signed)
Appt cancelled This encounter was created in error - please disregard. 

## 2022-12-23 ENCOUNTER — Ambulatory Visit: Payer: BC Managed Care – PPO | Admitting: Cardiovascular Disease

## 2023-03-22 ENCOUNTER — Encounter: Payer: Self-pay | Admitting: Cardiovascular Disease

## 2023-03-22 NOTE — Progress Notes (Unsigned)
Jeremy Blair Date of Birth  [birth year] [facility]     [place] [facility]  [address]    [address]   [address] [place], [place]  [address]    [place], [place]  [address] [phone]  Fax  [phone]  [phone]  Fax [phone]  Problem list:  1. Hypercholesterolemia   History of Present Illness:  Jeremy Blair is a 62 yo with hx of hyperlipidemia.  He is having some foot problems and is scheduled to see a podiatrist.  He thinks he may have some gout.  He has tried prednisone but it did not seem to help.   He has not had any cardiac problems  Feb. 5, 2014:  Oct 12, 2013: Jeremy Blair is doing ok.   He stopped his Niacin several months ago due to itching ( different from the flushing)   Labs from 2 days ago look good.   Sept. 8, 2016:  Doing well.  Not exercising much .  Lipids recently look great .   Dec. 8, 2017:  No CP or dyspnea.  Not exercising as much.   Not enough time .  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  Has gained some weight.   February 22nd, 2021:  Jeremy Blair is seen today for follow-up visit of his hyperlipidemia.  He was last seen 3 years ago. Was sent back over by Willeen Cass for further evaluation of his  carotid stenosis .   He had a carotid duplex scan which reveals mild bilateral carotid artery disease.  His recent labs with Dr. Hal Hope look good. Dr,. Hal Hope  has been following his lipids recently. LDL has been around 60.   Trigs are up and down . No CP , no dyspnea   Sep 23, 2021: Jeremy Blair is seen today for follow-up visit.  He has a history of hyperlipidemia.  He has very mild carotid artery disease. Retired from [employer] last year. (37 years)  Has been traveling  No Cp , no dyspena  Lipids are monitored  by Dr. Hal Hope  Advised him to watch his carbs .    Oct. 28, 2024  Jeremy Blair is seen for follow up of his HLD and carotid artery stenosis His lipids have been managed by Dr. Hal Hope  No CP or dyspnea   Is exercising a  bit more  Pickle ball   Lipids are managed by Dr. Hal Hope       Current Outpatient Medications on File Prior to Visit  Medication Sig Dispense Refill   atorvastatin (LIPITOR) 40 MG tablet TAKE 1 TABLET(40 MG) BY MOUTH DAILY AT 6 PM 90 tablet 3   buPROPion (WELLBUTRIN XL) 300 MG 24 hr tablet Take 300 mg by mouth daily.     Multiple Vitamin (MULTIVITAMIN) tablet Take 1 tablet by mouth daily.       tamsulosin (FLOMAX) 0.4 MG CAPS capsule Take 0.4 mg by mouth daily.     No current facility-administered medications on file prior to visit.    No Known Allergies  Past Medical History:  Diagnosis Date   Abnormal EKG    High cholesterol     History reviewed. No pertinent surgical history.  Social History   Tobacco Use  Smoking Status Former   Current packs/day: 0.00   Types: Cigarettes   Quit date: 03/06/2006   Years since quitting: 17.0  Smokeless Tobacco Never    Social History   Substance and Sexual Activity  Alcohol Use Yes  Family History  Problem Relation Age of Onset   Leukemia Father        deceased age 70   Coronary artery disease Father        CABG/AAA/Stents    Reviw of Systems:  Reviewed in the HPI.  All other systems are negative.   Physical Exam: Blood pressure 130/72, pulse 91, height 5\' 9"  (1.753 m), weight 162 lb 9.6 oz (73.8 kg), SpO2 96%.      GEN:  Well nourished, well developed in no acute distress HEENT: Normal NECK: No JVD; No carotid bruits LYMPHATICS: No lymphadenopathy CARDIAC: RRR , no murmurs, rubs, gallops RESPIRATORY:  Clear to auscultation without rales, wheezing or rhonchi  ABDOMEN: Soft, non-tender, non-distended MUSCULOSKELETAL:  No edema; No deformity  SKIN: Warm and dry NEUROLOGIC:  Alert and oriented x 3    ECG:    EKG Interpretation Date/Time:  Monday March 23 2023 08:34:45 EDT Ventricular Rate:  81 PR Interval:  142 QRS Duration:  82 QT Interval:  352 QTC Calculation: 408 R Axis:   86  Text  Interpretation: Normal sinus rhythm Normal ECG No previous ECGs available Confirmed by Kristeen Miss ([id number]) on 03/23/2023 8:54:34 AM      Assessment / Plan:   1. Hypercholesterolemia-    lipids are managed by Dr. Hal Hope  2.  Carotid stenosis:    has mild carotid stenosis.  Duplex scans have been stable .  Will repeat in year      Kristeen Miss, MD  03/23/2023 8:58 AM    [facility] [address] [place],  [address] [place], [place]  [address] Pager [phone] Phone: [phone]; Fax: [phone]   [facility]  [address] [place], [place]  [address] [phone]   Fax [phone]

## 2023-03-23 ENCOUNTER — Ambulatory Visit: Payer: BC Managed Care – PPO | Attending: Cardiovascular Disease | Admitting: Cardiovascular Disease

## 2023-03-23 ENCOUNTER — Encounter: Payer: Self-pay | Admitting: Cardiovascular Disease

## 2023-03-23 VITALS — BP 130/72 | HR 91 | Ht 69.0 in | Wt 162.6 lb

## 2023-03-23 DIAGNOSIS — I6523 Occlusion and stenosis of bilateral carotid arteries: Secondary | ICD-10-CM | POA: Diagnosis not present

## 2023-03-23 DIAGNOSIS — R9431 Abnormal electrocardiogram [ECG] [EKG]: Secondary | ICD-10-CM

## 2023-03-23 DIAGNOSIS — E782 Mixed hyperlipidemia: Secondary | ICD-10-CM | POA: Diagnosis not present

## 2023-03-23 NOTE — Patient Instructions (Signed)
Testing/Procedures: Carotid Ultrasound (in one year) Your physician has requested that you have a carotid duplex. This test is an ultrasound of the carotid arteries in your neck. It looks at blood flow through these arteries that supply the brain with blood. Allow one hour for this exam. There are no restrictions or special instructions.  Follow-Up: At [facility], you and your health needs are our priority.  As part of our continuing mission to provide you with exceptional heart care, we have created designated Provider Care Teams.  These Care Teams include your primary Cardiologist (physician) and Advanced Practice Providers (APPs -  Physician Assistants and Nurse Practitioners) who all work together to provide you with the care you need, when you need it.  Your next appointment:   1 year(s)  Provider:   Kristeen Miss, MD

## 2024-02-03 ENCOUNTER — Ambulatory Visit (HOSPITAL_COMMUNITY)
Admission: RE | Admit: 2024-02-03 | Discharge: 2024-02-03 | Disposition: A | Discharge status: Home / Self Care | Source: Ambulatory Visit | Attending: Cardiovascular Disease | Admitting: Cardiovascular Disease

## 2024-02-03 DIAGNOSIS — I6523 Occlusion and stenosis of bilateral carotid arteries: Secondary | ICD-10-CM | POA: Diagnosis present

## 2024-02-04 ENCOUNTER — Ambulatory Visit: Payer: Self-pay | Admitting: Physician Assistant

## 2024-03-09 LAB — LAB REPORT - SCANNED: A1c: 5.9

## 2024-03-21 ENCOUNTER — Encounter: Payer: Self-pay | Admitting: Physician Assistant

## 2024-03-21 ENCOUNTER — Ambulatory Visit: Attending: Physician Assistant | Admitting: Physician Assistant

## 2024-03-21 ENCOUNTER — Ambulatory Visit: Payer: Self-pay | Admitting: Student in an Organized Health Care Education/Training Program

## 2024-03-21 VITALS — BP 118/70 | HR 82 | Resp 16 | Ht 69.0 in | Wt 175.0 lb

## 2024-03-21 DIAGNOSIS — E785 Hyperlipidemia, unspecified: Secondary | ICD-10-CM | POA: Diagnosis not present

## 2024-03-21 DIAGNOSIS — I6523 Occlusion and stenosis of bilateral carotid arteries: Secondary | ICD-10-CM | POA: Diagnosis not present

## 2024-03-21 NOTE — Progress Notes (Signed)
 Cardiology Office Note   Date:  03/21/2024  ID:  Jeremy Blair, DOB 09-09-1960, MRN [id number] PCP: Burney Darice CROME, MD  [facility] Providers Cardiologist:  Dr. Alveta - set up with Dr. Floretta  History of Present Illness Jeremy Blair is a 63 y.o. male with past medical history of hyperlipidemia and a history of mild carotid artery disease.  He had itching on niacin .  Lipid is being managed by primary care provider.  Patient was last seen by Dr. Alveta in October 2024 at which time he was doing well and was playing pickle ball regularly.  Recent carotid Doppler obtained in September 2025 showed less than 40% bilateral ICA disease, normal right vertebral artery flow, atypical antegrade flow in the left vertebral artery.  Disturbed right subclavian artery flow.   Patient presents today for follow-up.  He denies any chest pain or shortness of breath.  He continued to play pickle ball at [employer] program.  He goes there twice a week and play for roughly 3 hours.  He has no claudication symptoms with regular activity.  Patient states he is getting yearly blood work and CT of the chest from his PCPs office.  There has been no significant abnormality on the CT.  His father did have thoracic aortic aneurysm and required bypass surgery and aortic root replacement, however patient does not have history of CAD or thoracic aortic aneurysm.  On physical exam, there is no pulsatile mass in his abdomen either.  He is doing very well and can follow-up with cardiology service next year.  Since his primary cardiologist Dr. Alveta has retired, I plan to set the patient up with Dr. Floretta as his new cardiologist.  I did offer the patient to either follow-up with cardiology service on a yearly basis prole for preventative measure or follow-up with the cardiology service as needed, he opted to follow-up on a yearly basis.  ROS:   He denies chest pain, palpitations, dyspnea, pnd,  orthopnea, n, v, dizziness, syncope, edema, weight gain, or early satiety. All other systems reviewed and are otherwise negative except as noted above.    Studies Reviewed          Risk Assessment/Calculations           Physical Exam VS:  BP 118/70 (BP Location: Left Arm, Patient Position: Sitting, Cuff Size: Normal)   Pulse 82   Resp 16   Ht 5' 9 (1.753 m)   Wt 175 lb (79.4 kg)   SpO2 93%   BMI 25.84 kg/m        Wt Readings from Last 3 Encounters:  03/21/24 175 lb (79.4 kg)  03/23/23 162 lb 9.6 oz (73.8 kg)  09/23/21 172 lb 12.8 oz (78.4 kg)    GEN: Well nourished, well developed in no acute distress NECK: No JVD; No carotid bruits CARDIAC: RRR, no murmurs, rubs, gallops RESPIRATORY:  Clear to auscultation without rales, wheezing or rhonchi  ABDOMEN: Soft, non-tender, non-distended EXTREMITIES:  No edema; No deformity   ASSESSMENT AND PLAN  Hyperlipidemia: currently on atorvastatin  40 mg daily.  Tolerating it without any side effect.  Annual lipid panel followed by PCP.  We will request the records from his PCPs office to make sure cholesterol is very well-controlled.  Current LDL goal should be less than 100.  Carotid artery disease: Recent carotid ultrasound showed 1 to 39% bilateral ICA disease.  No significant dizziness.  Denies any exertional chest pain or claudication symptom.  Dispo: Established with Dr. Floretta in 1 year.  Signed, Scot Ford, PA

## 2024-03-21 NOTE — Patient Instructions (Signed)
 Medication Instructions:  Your physician recommends that you continue on your current medications as directed. Please refer to the Current Medication list given to you today.  *If you need a refill on your cardiac medications before your next appointment, please call your pharmacy*  Lab Work: None If you have labs (blood work) drawn today and your tests are completely normal, you will receive your results only by: MyChart Message (if you have MyChart) OR A paper copy in the mail If you have any lab test that is abnormal or we need to change your treatment, we will call you to review the results.  Testing/Procedures: None  Follow-Up: At [facility], you and your health needs are our priority.  As part of our continuing mission to provide you with exceptional heart care, our providers are all part of one team.  This team includes your primary Cardiologist (physician) and Advanced Practice Providers or APPs (Physician Assistants and Nurse Practitioners) who all work together to provide you with the care you need, when you need it.  Your next appointment:   1 year(s)  Provider:   Dr. Georganna Archer  We recommend signing up for the patient portal called MyChart.  Sign up information is provided on this After Visit Summary.  MyChart is used to connect with patients for Virtual Visits (Telemedicine).  Patients are able to view lab/test results, encounter notes, upcoming appointments, etc.  Non-urgent messages can be sent to your provider as well.   To learn more about what you can do with MyChart, go to [email or web address].   Other Instructions None
